# Patient Record
Sex: Female | Born: 1960 | Race: White | Hispanic: No | Marital: Married | Smoking: Never smoker
Health system: Southern US, Community
[De-identification: ages and names within clinical notes are randomized; demographics above are authoritative.]

## PROBLEM LIST (undated history)

## (undated) DIAGNOSIS — E559 Vitamin D deficiency, unspecified: Secondary | ICD-10-CM

## (undated) DIAGNOSIS — I447 Left bundle-branch block, unspecified: Secondary | ICD-10-CM

## (undated) DIAGNOSIS — Z780 Asymptomatic menopausal state: Secondary | ICD-10-CM

## (undated) DIAGNOSIS — R638 Other symptoms and signs concerning food and fluid intake: Secondary | ICD-10-CM

## (undated) DIAGNOSIS — K589 Irritable bowel syndrome without diarrhea: Secondary | ICD-10-CM

## (undated) DIAGNOSIS — N879 Dysplasia of cervix uteri, unspecified: Secondary | ICD-10-CM

## (undated) DIAGNOSIS — N950 Postmenopausal bleeding: Secondary | ICD-10-CM

## (undated) DIAGNOSIS — A6000 Herpesviral infection of urogenital system, unspecified: Secondary | ICD-10-CM

## (undated) DIAGNOSIS — E7800 Pure hypercholesterolemia, unspecified: Secondary | ICD-10-CM

## (undated) DIAGNOSIS — M8580 Other specified disorders of bone density and structure, unspecified site: Secondary | ICD-10-CM

## (undated) HISTORY — PX: OTHER SURGICAL HISTORY: SHX169

## (undated) HISTORY — DX: Asymptomatic menopausal state: Z78.0

## (undated) HISTORY — DX: Herpesviral infection of urogenital system, unspecified: A60.00

## (undated) HISTORY — PX: BREAST EXCISIONAL BIOPSY: SUR124

## (undated) HISTORY — DX: Pure hypercholesterolemia, unspecified: E78.00

## (undated) HISTORY — DX: Irritable bowel syndrome, unspecified: K58.9

## (undated) HISTORY — PX: CARDIAC SURGERY: SHX584

## (undated) HISTORY — DX: Dysplasia of cervix uteri, unspecified: N87.9

## (undated) HISTORY — DX: Postmenopausal bleeding: N95.0

## (undated) HISTORY — DX: Left bundle-branch block, unspecified: I44.7

## (undated) HISTORY — PX: LEEP: SHX91

## (undated) HISTORY — DX: Other symptoms and signs concerning food and fluid intake: R63.8

## (undated) HISTORY — PX: DIAGNOSTIC LAPAROSCOPY: SUR761

## (undated) HISTORY — DX: Vitamin D deficiency, unspecified: E55.9

## (undated) HISTORY — DX: Other specified disorders of bone density and structure, unspecified site: M85.80

## (undated) HISTORY — PX: BREAST BIOPSY: SHX20

---
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
## 2000-07-05 HISTORY — PX: TUBAL LIGATION: SHX77

## 2003-08-26 ENCOUNTER — Other Ambulatory Visit: Payer: Self-pay

## 2004-08-06 ENCOUNTER — Ambulatory Visit: Payer: Self-pay | Admitting: Occupational Therapy

## 2005-08-10 ENCOUNTER — Ambulatory Visit: Payer: Self-pay | Admitting: Obstetrics and Gynecology

## 2006-08-16 ENCOUNTER — Ambulatory Visit: Payer: Self-pay | Admitting: Obstetrics and Gynecology

## 2007-09-11 ENCOUNTER — Ambulatory Visit: Payer: Self-pay | Admitting: Obstetrics and Gynecology

## 2008-09-12 ENCOUNTER — Ambulatory Visit: Payer: Self-pay | Admitting: Obstetrics and Gynecology

## 2009-12-10 ENCOUNTER — Encounter: Admission: RE | Admit: 2009-12-10 | Discharge: 2009-12-10 | Payer: Self-pay | Admitting: Neurosurgery

## 2010-03-25 ENCOUNTER — Ambulatory Visit: Payer: Self-pay | Admitting: Obstetrics and Gynecology

## 2010-04-07 ENCOUNTER — Ambulatory Visit: Payer: Self-pay | Admitting: Obstetrics and Gynecology

## 2010-06-23 ENCOUNTER — Ambulatory Visit: Payer: Self-pay

## 2011-05-13 ENCOUNTER — Ambulatory Visit: Payer: Self-pay | Admitting: Obstetrics and Gynecology

## 2011-07-06 LAB — HM COLONOSCOPY

## 2012-05-22 ENCOUNTER — Ambulatory Visit: Payer: Self-pay | Admitting: Gastroenterology

## 2012-08-22 ENCOUNTER — Ambulatory Visit: Payer: Self-pay | Admitting: Obstetrics and Gynecology

## 2014-03-22 LAB — HM PAP SMEAR: HM Pap smear: NEGATIVE

## 2014-03-26 DIAGNOSIS — I447 Left bundle-branch block, unspecified: Secondary | ICD-10-CM | POA: Insufficient documentation

## 2014-03-26 DIAGNOSIS — E785 Hyperlipidemia, unspecified: Secondary | ICD-10-CM | POA: Insufficient documentation

## 2014-10-01 ENCOUNTER — Ambulatory Visit: Payer: Self-pay | Admitting: Obstetrics and Gynecology

## 2014-10-01 LAB — HM MAMMOGRAPHY

## 2014-12-17 ENCOUNTER — Other Ambulatory Visit: Payer: Self-pay

## 2014-12-17 ENCOUNTER — Other Ambulatory Visit: Payer: BLUE CROSS/BLUE SHIELD

## 2014-12-17 ENCOUNTER — Ambulatory Visit (INDEPENDENT_AMBULATORY_CARE_PROVIDER_SITE_OTHER): Payer: BLUE CROSS/BLUE SHIELD | Admitting: Obstetrics and Gynecology

## 2014-12-17 ENCOUNTER — Encounter: Payer: Self-pay | Admitting: Obstetrics and Gynecology

## 2014-12-17 VITALS — BP 122/68 | HR 46 | Ht 65.0 in | Wt 158.6 lb

## 2014-12-17 DIAGNOSIS — N950 Postmenopausal bleeding: Secondary | ICD-10-CM

## 2014-12-17 DIAGNOSIS — Z780 Asymptomatic menopausal state: Secondary | ICD-10-CM | POA: Diagnosis not present

## 2014-12-17 NOTE — Patient Instructions (Signed)
1.  No need for endometrial biopsy due to normal ultrasound. 2.  Monitor for vasomotor symptoms or vaginal dryness. 3.  Follow-up in September for annual exam as scheduled. 4.  Continue with calcium supplementation and weightbearing exercise.

## 2014-12-17 NOTE — Progress Notes (Signed)
Patient ID: Evelyn Reese, female   DOB: 05-22-1961, 54 y.o.   MRN: [id number]   Pt is a 3 month f/u on pmb. Pt d/c all hrt about 3 months ago. NO bleeding. See u/s report.  GYN ENCOUNTER NOTE  Subjective:       Evelyn Reese is a 54 y.o. No obstetric history on file. female is here for gynecologic evaluation of the following issues:  1. Postmenopausal bleeding. 2.  Follow-up on pelvic ultrasound.  Evelyn Reese presents today for follow-up on pelvic ultrasound.  She previously had been on estradiol and micronized progesterone for hormone replacement therapy and had developed postmenopausal spotting.  3 months ago.  She discontinued her hormone replacement therapy and had no further bleeding.  Review of pelvic ultrasound now demonstrates the patient having a normal menstrual stripe measuring 4 mm.  No other focal length from allergies of the uterus are seen.  The patient remains asymptomatic off of HRT therapy.  She wishes to not return to HRT therapy.  Previously, the patient had undergone an attempted endometrial biopsy.  Because of cervical stenosis.  She is not requiring biopsy at this time.  She will continue with menstrual calendar, monitoring and notify us if she develops any bleeding.  She is scheduled for follow-up in September 2016 normal exam..     Gynecologic History Patient's last menstrual period was 09/16/2014 (approximate). Contraception: post menopausal status  Obstetric History OB History  No data available    Past Medical History  Diagnosis Date  . Genital herpes   . Hypercholesterolemia   . Increased BMI   . Dysplasia of cervix   . IBS (irritable bowel syndrome)   . Menopause   . PMB (postmenopausal bleeding)   . Vitamin D deficiency   . Osteopenia     Past Surgical History  Procedure Laterality Date  . Tubal ligation  2002  . Nodule removed Left 1980    breast  . Tonsillectomy  1972  . Diagnostic laparoscopy      Current Outpatient Prescriptions  on File Prior to Visit  Medication Sig Dispense Refill  . calcium-vitamin D (OSCAL) 250-125 MG-UNIT per tablet Take 1 tablet by mouth daily.     No current facility-administered medications on file prior to visit.    Allergies  Allergen Reactions  . Penicillins Rash    History   Social History  . Marital Status: Married    Spouse Name: N/A  . Number of Children: N/A  . Years of Education: N/A   Occupational History  . Not on file.   Social History Main Topics  . Smoking status: Never Smoker   . Smokeless tobacco: Not on file  . Alcohol Use: 0.6 oz/week    1 Standard drinks or equivalent per week  . Drug Use: No  . Sexual Activity:    Partners: Male   Other Topics Concern  . Not on file   Social History Narrative    Family History  Problem Relation Age of Onset  . Lymphoma Mother   . Hypertension Father   . Arthritis Father   . Heart disease Brother   . Breast cancer Neg Hx   . Ovarian cancer Neg Hx   . Colon cancer Neg Hx     The following portions of the patient's history were reviewed and updated as appropriate: allergies, current medications, past family history, past medical history, past social history, past surgical history and problem list.  Review of Systems Review of Systems -  General ROS: negative for - chills, fatigue, fever, hot flashes, malaise or night sweats Hematological and Lymphatic ROS: negative for - bleeding problems or swollen lymph nodes Gastrointestinal ROS: negative for - abdominal pain, blood in stools, change in bowel habits and nausea/vomiting Musculoskeletal ROS: negative for - joint pain, muscle pain or muscular weakness Genito-Urinary ROS: negative for - change in menstrual cycle, dysmenorrhea, dyspareunia, dysuria, genital discharge, genital ulcers, hematuria, incontinence, irregular/heavy menses, nocturia or pelvic pain  Objective:   BP 122/68 mmHg  Pulse 46  Ht 5\' 5"  (1.651 m)  Wt 158 lb 9.6 oz (71.94 kg)  BMI 26.39  kg/m2  LMP 09/16/2014 (Approximate) Deferred  Assessment:   1.  Postmenopausal bleeding   Plan:    1.  No endometrial biopsy needed. 2.  Discontinue all HRT therapy. 3.  Menstrual calendar, monitoring. 4.  Annual exam September 2016  A total of 15 minutes were spent face-to-face with the patient during this encounter and over half of that time dealt with counseling and coordination of care.

## 2014-12-18 DIAGNOSIS — Z780 Asymptomatic menopausal state: Secondary | ICD-10-CM | POA: Insufficient documentation

## 2014-12-18 DIAGNOSIS — K589 Irritable bowel syndrome without diarrhea: Secondary | ICD-10-CM | POA: Insufficient documentation

## 2014-12-18 DIAGNOSIS — N950 Postmenopausal bleeding: Secondary | ICD-10-CM | POA: Insufficient documentation

## 2014-12-18 DIAGNOSIS — E785 Hyperlipidemia, unspecified: Secondary | ICD-10-CM | POA: Insufficient documentation

## 2014-12-18 DIAGNOSIS — M8580 Other specified disorders of bone density and structure, unspecified site: Secondary | ICD-10-CM | POA: Insufficient documentation

## 2014-12-18 NOTE — Patient Instructions (Signed)
1. No endometrial biopsy needed.  2. Discontinue all HRT therapy.  3. Menstrual calendar, monitoring.  4. Annual exam September 2016

## 2015-04-01 ENCOUNTER — Encounter: Payer: Self-pay | Admitting: Obstetrics and Gynecology

## 2015-04-22 ENCOUNTER — Encounter: Payer: Self-pay | Admitting: Obstetrics and Gynecology

## 2015-04-22 ENCOUNTER — Ambulatory Visit (INDEPENDENT_AMBULATORY_CARE_PROVIDER_SITE_OTHER): Payer: BLUE CROSS/BLUE SHIELD | Admitting: Obstetrics and Gynecology

## 2015-04-22 VITALS — BP 110/64 | HR 42 | Ht 65.0 in | Wt 167.0 lb

## 2015-04-22 DIAGNOSIS — Z0000 Encounter for general adult medical examination without abnormal findings: Secondary | ICD-10-CM

## 2015-04-22 DIAGNOSIS — Z1211 Encounter for screening for malignant neoplasm of colon: Secondary | ICD-10-CM

## 2015-04-22 DIAGNOSIS — A6000 Herpesviral infection of urogenital system, unspecified: Secondary | ICD-10-CM

## 2015-04-22 DIAGNOSIS — N879 Dysplasia of cervix uteri, unspecified: Secondary | ICD-10-CM

## 2015-04-22 DIAGNOSIS — Z01419 Encounter for gynecological examination (general) (routine) without abnormal findings: Secondary | ICD-10-CM

## 2015-04-22 NOTE — Progress Notes (Signed)
Patient ID: Evelyn Reese, female   DOB: [birth year], 54 y.o.   MRN: [id number] ANNUAL PREVENTATIVE CARE GYN  ENCOUNTER NOTE  Subjective:       Evelyn EverySherri W Boonstra is a 54 y.o. No obstetric history on file. female here for a routine annual gynecologic exam.  Current complaints: 1.  Annual    Gynecologic History Menopause Contraception: n/a Last Pap: 03/12/2013. Results were: negative/negative hpv Last mammogram: 10/01/2014 Results were: [facility] 1  Obstetric History OB History  No data available    Past Medical History  Diagnosis Date  . Genital herpes   . Hypercholesterolemia   . Increased BMI   . Dysplasia of cervix   . IBS (irritable bowel syndrome)   . Menopause   . PMB (postmenopausal bleeding)   . Vitamin D deficiency   . Osteopenia     Past Surgical History  Procedure Laterality Date  . Tubal ligation  2002  . Nodule removed Left 1980    breast  . Tonsillectomy  1972  . Diagnostic laparoscopy      Current Outpatient Prescriptions on File Prior to Visit  Medication Sig Dispense Refill  . calcium-vitamin D (OSCAL) 250-125 MG-UNIT per tablet Take 1 tablet by mouth daily.    Marland Kitchen. ibuprofen (ADVIL,MOTRIN) 200 MG tablet Take 200 mg by mouth as needed.    Marland Kitchen. ibuprofen (ADVIL,MOTRIN) 800 MG tablet Take by mouth.     No current facility-administered medications on file prior to visit.    Allergies  Allergen Reactions  . Penicillins Rash    Social History   Social History  . Marital Status: Married    Spouse Name: N/A  . Number of Children: N/A  . Years of Education: N/A   Occupational History  . Not on file.   Social History Main Topics  . Smoking status: Never Smoker   . Smokeless tobacco: Not on file  . Alcohol Use: 0.6 oz/week    1 Standard drinks or equivalent per week  . Drug Use: No  . Sexual Activity:    Partners: Male   Other Topics Concern  . Not on file   Social History Narrative    Family History  Problem Relation Age of Onset  .  Lymphoma Mother   . Hypertension Father   . Arthritis Father   . Heart disease Brother   . Breast cancer Neg Hx   . Ovarian cancer Neg Hx   . Colon cancer Neg Hx     The following portions of the patient's history were reviewed and updated as appropriate: allergies, current medications, past family history, past medical history, past social history, past surgical history and problem list.  Review of Systems ROS Review of Systems - General ROS: negative for - chills, fatigue, fever, hot flashes, night sweats, weight gain or weight loss Psychological ROS: negative for - anxiety, decreased libido, depression, mood swings, physical abuse or sexual abuse Ophthalmic ROS: negative for - blurry vision, eye pain or loss of vision ENT ROS: negative for - headaches, hearing change, visual changes or vocal changes Allergy and Immunology ROS: negative for - hives, itchy/watery eyes or seasonal allergies Hematological and Lymphatic ROS: negative for - bleeding problems, bruising, swollen lymph nodes or weight loss Endocrine ROS: negative for - galactorrhea, hair pattern changes, hot flashes, malaise/lethargy, mood swings, palpitations, polydipsia/polyuria, skin changes, temperature intolerance or unexpected weight changes Breast ROS: negative for - new or changing breast lumps or nipple discharge Respiratory ROS: negative for - cough or shortness  of breath Cardiovascular ROS: negative for - chest pain, irregular heartbeat, palpitations or shortness of breath Gastrointestinal ROS: no abdominal pain, change in bowel habits, or black or bloody stools Genito-Urinary ROS: no dysuria, trouble voiding, or hematuria Musculoskeletal ROS: negative for - joint pain or joint stiffness Neurological ROS: negative for - bowel and bladder control changes Dermatological ROS: negative for rash and skin lesion changes   Objective:   LMP 09/16/2014 (Approximate) CONSTITUTIONAL: Well-developed, well-nourished female in  no acute distress.  PSYCHIATRIC: Normal mood and affect. Normal behavior. Normal judgment and thought content. NEUROLGIC: Alert and oriented to person, place, and time. Normal muscle tone coordination. No cranial nerve deficit noted. HENT:  Normocephalic, atraumatic, External right and left ear normal. Oropharynx is clear and moist EYES: Conjunctivae and EOM are normal. Pupils are equal, round, and reactive to light. No scleral icterus.  NECK: Normal range of motion, supple, no masses.  Normal thyroid.  SKIN: Skin is warm and dry. No rash noted. Not diaphoretic. No erythema. No pallor. CARDIOVASCULAR: Normal heart rate noted, regular rhythm, no murmur. Porfirio Oar: Clear to auscultation bilaterally. Effort and breath sounds normal, no problems with respiration noted. BREASTS: Symmetric in size. No masses, skin changes, nipple drainage, or lymphadenopathy. DOMEN: Soft, normal bowel sounds, no distention noted.  No tenderness, rebound or guarding.  BLADDER: Normal PELVIC:  External Genitalia: Normal  BUS: Normal  Vagina: Normal  Cervix: Normal  Uterus: Normal  Adnexa: Normal  RV: External Exam NormaI, No Rectal Masses and Normal Sphincter tone  MUSCULOSKELETAL: Normal range of motion. No tenderness.  No cyanosis, clubbing, or edema.  2+ distal pulses. LYMPHATIC: No Axillary, Supraclavicular, or Inguinal Adenopathy.    Assessment:   Annual gynecologic examination 54 y.o. Contraception: menopause BMI: 27.8  (2015 BMI: 26.4. Gain of approx. 8 lbs.)   Plan:  Pap: 03/22/2013 negative/negative. Pap smear is needed in 2017. Mammogram: not ordered (done 09/2014) Stool Guaiac Testing:  ordered Labs: done by pcp Routine preventative health maintenance measures emphasized: Exercise/Diet/Weight control, Tobacco Warnings and Alcohol/Substance use risks Return to Clinic - 1 Year   Fenton Malling, LPN  Herold Harms, MD

## 2015-04-22 NOTE — Patient Instructions (Signed)
1.  Pap smear is due in 2017. 2.  Next mammogram is due in March 2017. 3.  Stool guaiac cards for colon cancer screening are given. 4.  Routine screening labs are to be done at [facility]. 5.  Return in one year for annual physical

## 2015-04-27 DIAGNOSIS — A6000 Herpesviral infection of urogenital system, unspecified: Secondary | ICD-10-CM | POA: Insufficient documentation

## 2015-04-27 DIAGNOSIS — G43909 Migraine, unspecified, not intractable, without status migrainosus: Secondary | ICD-10-CM | POA: Insufficient documentation

## 2015-04-27 DIAGNOSIS — M064 Inflammatory polyarthropathy: Secondary | ICD-10-CM | POA: Insufficient documentation

## 2015-04-27 DIAGNOSIS — Z807 Family history of other malignant neoplasms of lymphoid, hematopoietic and related tissues: Secondary | ICD-10-CM | POA: Insufficient documentation

## 2015-04-27 DIAGNOSIS — N879 Dysplasia of cervix uteri, unspecified: Secondary | ICD-10-CM | POA: Insufficient documentation

## 2015-04-27 DIAGNOSIS — E559 Vitamin D deficiency, unspecified: Secondary | ICD-10-CM | POA: Insufficient documentation

## 2015-04-27 DIAGNOSIS — M419 Scoliosis, unspecified: Secondary | ICD-10-CM | POA: Insufficient documentation

## 2015-05-05 ENCOUNTER — Other Ambulatory Visit: Payer: Self-pay | Admitting: Obstetrics and Gynecology

## 2015-05-09 LAB — FECAL OCCULT BLOOD, IMMUNOCHEMICAL: Fecal Occult Bld: NEGATIVE

## 2016-04-22 ENCOUNTER — Ambulatory Visit (INDEPENDENT_AMBULATORY_CARE_PROVIDER_SITE_OTHER): Payer: BLUE CROSS/BLUE SHIELD | Admitting: Obstetrics and Gynecology

## 2016-04-22 ENCOUNTER — Encounter: Payer: Self-pay | Admitting: Obstetrics and Gynecology

## 2016-04-22 VITALS — BP 87/53 | HR 105 | Ht 62.0 in | Wt 171.1 lb

## 2016-04-22 DIAGNOSIS — Z1211 Encounter for screening for malignant neoplasm of colon: Secondary | ICD-10-CM | POA: Diagnosis not present

## 2016-04-22 DIAGNOSIS — A6000 Herpesviral infection of urogenital system, unspecified: Secondary | ICD-10-CM | POA: Diagnosis not present

## 2016-04-22 DIAGNOSIS — Z01419 Encounter for gynecological examination (general) (routine) without abnormal findings: Secondary | ICD-10-CM | POA: Diagnosis not present

## 2016-04-22 DIAGNOSIS — N952 Postmenopausal atrophic vaginitis: Secondary | ICD-10-CM | POA: Diagnosis not present

## 2016-04-22 DIAGNOSIS — N879 Dysplasia of cervix uteri, unspecified: Secondary | ICD-10-CM

## 2016-04-22 DIAGNOSIS — Z1231 Encounter for screening mammogram for malignant neoplasm of breast: Secondary | ICD-10-CM | POA: Diagnosis not present

## 2016-04-22 DIAGNOSIS — Z780 Asymptomatic menopausal state: Secondary | ICD-10-CM | POA: Diagnosis not present

## 2016-04-22 DIAGNOSIS — Z1239 Encounter for other screening for malignant neoplasm of breast: Secondary | ICD-10-CM

## 2016-04-22 NOTE — Patient Instructions (Signed)
1. Pap smear is completed 2. Mammogram is ordered 3. Stool guaiac cards are given for colon cancer screening 4. Continue with calcium and vitamin D supplementation 5. Continue with healthy eating and exercise 6. Consider Intrarosa for vaginal dryness 7. Return in 1 year for physical

## 2016-04-22 NOTE — Progress Notes (Signed)
ANNUAL PREVENTATIVE CARE GYN  ENCOUNTER NOTE  Subjective:       Evelyn Reese is a 55 y.o. 702P2002 female here for a routine annual gynecologic exam.  Current complaints: 1.   None  Main gynecologic issue is vaginal dryness where she is intermittently using lubricants only previously she had been on estrogen but does not does desire to be on that medication. Bowel and bladder function are normal. Patient notes occasional vasomotor symptom.   Gynecologic History Patient's last menstrual period was 09/16/2014 (approximate). Contraception: post menopausal status Last Pap: 03/2013 neg/neg. Results were: normal Last mammogram: 09/2014 birad 1. Results were: normal  Obstetric History OB History  Gravida Para Term Preterm AB Living  2 2 2     2   SAB TAB Ectopic Multiple Live Births          2    # Outcome Date GA Lbr Len/2nd Weight Sex Delivery Anes PTL Lv  2 Term      Vag-Spont   LIV  1 Term      Vag-Spont   LIV      Past Medical History:  Diagnosis Date  . Dysplasia of cervix   . Genital herpes   . Hypercholesterolemia   . IBS (irritable bowel syndrome)   . Increased BMI   . Menopause   . Osteopenia   . PMB (postmenopausal bleeding)   . Vitamin D deficiency     Past Surgical History:  Procedure Laterality Date  . DIAGNOSTIC LAPAROSCOPY    . LEEP    . nodule removed Left 1980   breast  . TONSILLECTOMY  1972  . TUBAL LIGATION  2002    Current Outpatient Prescriptions on File Prior to Visit  Medication Sig Dispense Refill  . Calcium Carb-Ergocalciferol 250-125 MG-UNIT TABS Take by mouth.    . calcium-vitamin D (OSCAL) 250-125 MG-UNIT per tablet Take 1 tablet by mouth daily.    Marland Kitchen. ibuprofen (ADVIL,MOTRIN) 200 MG tablet Take 200 mg by mouth as needed.    Marland Kitchen. ibuprofen (ADVIL,MOTRIN) 800 MG tablet Take by mouth.    . Multiple Vitamin (MULTI-VITAMINS) TABS Take by mouth.     No current facility-administered medications on file prior to visit.     Allergies  Allergen  Reactions  . Penicillins Rash    Social History   Social History  . Marital status: Married    Spouse name: N/A  . Number of children: N/A  . Years of education: N/A   Occupational History  . Not on file.   Social History Main Topics  . Smoking status: Never Smoker  . Smokeless tobacco: Never Used  . Alcohol use 0.6 oz/week    1 Standard drinks or equivalent per week  . Drug use: No  . Sexual activity: Yes    Partners: Male   Other Topics Concern  . Not on file   Social History Narrative  . No narrative on file    Family History  Problem Relation Age of Onset  . Lymphoma Mother   . Hypertension Father   . Arthritis Father   . Heart disease Brother   . Breast cancer Neg Hx   . Ovarian cancer Neg Hx   . Colon cancer Neg Hx     The following portions of the patient's history were reviewed and updated as appropriate: allergies, current medications, past family history, past medical history, past social history, past surgical history and problem list.  Review of Systems ROS Review of Systems -  General ROS: negative for - chills, fatigue, fever, hot flashes, night sweats, weight gain or weight loss Psychological ROS: negative for - anxiety, decreased libido, depression, mood swings, physical abuse or sexual abuse Ophthalmic ROS: negative for - blurry vision, eye pain or loss of vision ENT ROS: negative for - headaches, hearing change, visual changes or vocal changes Allergy and Immunology ROS: negative for - hives, itchy/watery eyes or seasonal allergies Hematological and Lymphatic ROS: negative for - bleeding problems, bruising, swollen lymph nodes or weight loss Endocrine ROS: negative for - galactorrhea, hair pattern changes, hot flashes, malaise/lethargy, mood swings, palpitations, polydipsia/polyuria, skin changes, temperature intolerance or unexpected weight changes Breast ROS: negative for - new or changing breast lumps or nipple discharge Respiratory ROS:  negative for - cough or shortness of breath Cardiovascular ROS: negative for - chest pain, irregular heartbeat, palpitations or shortness of breath Gastrointestinal ROS: no abdominal pain, change in bowel habits, or black or bloody stools Genito-Urinary ROS: no dysuria, trouble voiding, or hematuria Musculoskeletal ROS: negative for - joint pain or joint stiffness Neurological ROS: negative for - bowel and bladder control changes Dermatological ROS: negative for rash and skin lesion changes   Objective:   LMP 09/16/2014 (Approximate)   BP (!) 87/53   Pulse (!) 105   Ht 5\' 2"  (1.575 m)   Wt 171 lb 1.6 oz (77.6 kg)   LMP 09/16/2014 (Approximate)   BMI 31.29 kg/m   CONSTITUTIONAL: Well-developed, well-nourished female in no acute distress.  PSYCHIATRIC: Normal mood and affect. Normal behavior. Normal judgment and thought content. NEUROLGIC: Alert and oriented to person, place, and time. Normal muscle tone coordination. No cranial nerve deficit noted. HENT:  Normocephalic, atraumatic,o EYES: Conjunctivae and EOM are normal.  No scleral icterus.  NECK: Normal range of motion, supple, no masses.  Normal thyroid.  SKIN: Skin is warm and dry. No rash noted. Not diaphoretic. No erythema. No pallor. CARDIOVASCULAR: Normal heart rate noted, regular rhythm, no murmur. RESPIRATORY: Clear to auscultation bilaterally. Effort and breath sounds normal, no problems with respiration noted. BREASTS: Symmetric in size. No masses, skin changes, nipple drainage, or lymphadenopathy. ABDOMEN: Soft, normal bowel sounds, no distention noted.  No tenderness, rebound or guarding.  BLADDER: Normal PELVIC:  External Genitalia: Normal  BUS: Normal  Vagina: Severe atrophy  Cervix: Scarred; os is pinpoint; no cervical motion tenderness  Uterus: Normal; small, mobile, midplane  Adnexa: Normal; nonpalpable and nontender  RV: External Exam NormaI, No Rectal Masses and Normal Sphincter tone  MUSCULOSKELETAL: Normal  range of motion. No tenderness.  No cyanosis, clubbing, or edema.  2+ distal pulses. LYMPHATIC: No Axillary, Supraclavicular, or Inguinal Adenopathy.    Assessment:   Annual gynecologic examination 55 y.o. Contraception: post menopausal status [id number] Problem List Items Addressed This Visit    Menopause   Genital herpes   Cervical dysplasia    Other Visit Diagnoses    Well woman exam    -  Primary      Plan:  Pap: pap.hpv Mammogram: utd Stool Guaiac Testing:  Ordered Labs: thru pcp Routine preventative health maintenance measures emphasized: Exercise/Diet/Weight control, Tobacco Warnings and Alcohol/Substance use risks Consider Intrarosa for vaginal dryness. Continue with lubricants Return to Clinic - 1 Year   Crystal [place], [place]  Herold Harms, MD  Note: This dictation was prepared with Dragon dictation along with smaller phrase technology. Any transcriptional errors that result from this process are unintentional.

## 2016-04-29 LAB — PAP IG AND HPV HIGH-RISK
HPV, HIGH-RISK: NEGATIVE
PAP Smear Comment: 0

## 2016-04-30 ENCOUNTER — Telehealth: Payer: Self-pay | Admitting: Obstetrics and Gynecology

## 2016-04-30 NOTE — Telephone Encounter (Signed)
Patient called wanting to speak with you specifically. Patient knows you are not in the office today. Thanks

## 2016-05-03 NOTE — Telephone Encounter (Signed)
lmtrc

## 2016-05-04 NOTE — Telephone Encounter (Signed)
lmtrc

## 2016-05-06 ENCOUNTER — Telehealth: Payer: Self-pay | Admitting: Obstetrics and Gynecology

## 2016-05-06 NOTE — Telephone Encounter (Signed)
Patient returned your call. She stated not to call her home number but her cell [phone].Thanks

## 2016-05-07 NOTE — Telephone Encounter (Signed)
Pt aware of pap results.

## 2016-05-18 LAB — FECAL OCCULT BLOOD, IMMUNOCHEMICAL: FECAL OCCULT BLD: NEGATIVE

## 2016-08-18 DIAGNOSIS — I5022 Chronic systolic (congestive) heart failure: Secondary | ICD-10-CM | POA: Insufficient documentation

## 2016-09-16 DIAGNOSIS — Z8679 Personal history of other diseases of the circulatory system: Secondary | ICD-10-CM | POA: Insufficient documentation

## 2016-09-16 DIAGNOSIS — Q245 Malformation of coronary vessels: Secondary | ICD-10-CM | POA: Insufficient documentation

## 2016-09-16 DIAGNOSIS — Z952 Presence of prosthetic heart valve: Secondary | ICD-10-CM | POA: Insufficient documentation

## 2016-09-16 DIAGNOSIS — Z9889 Other specified postprocedural states: Secondary | ICD-10-CM | POA: Insufficient documentation

## 2016-09-19 DIAGNOSIS — I2510 Atherosclerotic heart disease of native coronary artery without angina pectoris: Secondary | ICD-10-CM | POA: Insufficient documentation

## 2016-09-21 DIAGNOSIS — Z7901 Long term (current) use of anticoagulants: Secondary | ICD-10-CM | POA: Insufficient documentation

## 2016-09-21 DIAGNOSIS — Z5181 Encounter for therapeutic drug level monitoring: Secondary | ICD-10-CM | POA: Insufficient documentation

## 2016-09-22 DIAGNOSIS — I4891 Unspecified atrial fibrillation: Secondary | ICD-10-CM | POA: Insufficient documentation

## 2016-12-07 ENCOUNTER — Other Ambulatory Visit: Payer: Self-pay | Admitting: Cardiology

## 2016-12-07 DIAGNOSIS — R1011 Right upper quadrant pain: Secondary | ICD-10-CM

## 2016-12-13 ENCOUNTER — Ambulatory Visit
Admission: RE | Admit: 2016-12-13 | Discharge: 2016-12-13 | Disposition: A | Discharge status: Home / Self Care | Payer: BLUE CROSS/BLUE SHIELD | Source: Ambulatory Visit | Attending: Cardiology | Admitting: Cardiology

## 2016-12-13 DIAGNOSIS — K8020 Calculus of gallbladder without cholecystitis without obstruction: Secondary | ICD-10-CM | POA: Diagnosis not present

## 2016-12-13 DIAGNOSIS — R1011 Right upper quadrant pain: Secondary | ICD-10-CM | POA: Diagnosis present

## 2017-01-25 ENCOUNTER — Ambulatory Visit: Payer: Self-pay

## 2017-02-02 DIAGNOSIS — Z95810 Presence of automatic (implantable) cardiac defibrillator: Secondary | ICD-10-CM | POA: Insufficient documentation

## 2017-04-25 NOTE — Progress Notes (Signed)
ANNUAL PREVENTATIVE CARE GYN  ENCOUNTER NOTE  Subjective:       Evelyn Reese is a 56 y.o. [id number] female here for a routine annual gynecologic exam.  Current complaints: 1.   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  She is currently undergoing with cardiac rehab at this time.   Bowel and bladder function are normal. Patient notes occasional vasomotor symptom. Patient remains sexually active and uses lubricants to help with dryness.   Gynecologic History Patient's last menstrual period was 09/16/2014 (approximate). Contraception: post menopausal status Last Pap: 04/22/2016 neg/neg. Results were: normal Last mammogram: 10/01/2014 birad 1. Results were: normal  Obstetric History OB History  Gravida Para Term Preterm AB Living  2 2 2     2   SAB TAB Ectopic Multiple Live Births          2    # Outcome Date GA Lbr Len/2nd Weight Sex Delivery Anes PTL Lv  2 Term      Vag-Spont   LIV  1 Term      Vag-Spont   LIV      Past Medical History:  Diagnosis Date  . Dysplasia of cervix   . Genital herpes   . Hypercholesterolemia   . IBS (irritable bowel syndrome)   . Increased BMI   . Menopause   . Osteopenia   . PMB (postmenopausal bleeding)   . Vitamin D deficiency     Past Surgical History:  Procedure Laterality Date  . DIAGNOSTIC LAPAROSCOPY    . LEEP    . nodule removed Left 1980   breast  . TONSILLECTOMY  1972  . TUBAL LIGATION  2002    Current Outpatient Prescriptions on File Prior to Visit  Medication Sig Dispense Refill  . calcium-vitamin D (OSCAL) 250-125 MG-UNIT per tablet Take 1 tablet by mouth daily.    Marland Kitchen. ibuprofen (ADVIL,MOTRIN) 800 MG tablet Take by mouth.    . Multiple Vitamin (MULTI-VITAMINS) TABS Take by mouth.     No  current facility-administered medications on file prior to visit.     Allergies  Allergen Reactions  . Penicillins Rash    Social History   Social History  . Marital status: Married    Spouse name: N/A  . Number of children: N/A  . Years of education: N/A   Occupational History  . Not on file.   Social History Main Topics  . Smoking status: Never Smoker  . Smokeless tobacco: Never Used  . Alcohol use 0.6 oz/week    1 Standard drinks or equivalent per week     Comment: weekly  . Drug use: No  . Sexual activity: Yes    Partners: Male    Birth control/ protection: Post-menopausal   Other Topics Concern  . Not on file   Social History Narrative  . No narrative on file    Family History  Problem Relation Age of Onset  . Lymphoma Mother   . Hypertension Father   . Arthritis Father   . Heart disease Brother   . Breast cancer Neg Hx   . Ovarian cancer Neg Hx   . Colon cancer Neg Hx   . Diabetes Neg Hx     The following portions of the patient's history were reviewed and updated as appropriate: allergies, current medications, past family history, past medical history,  past social history, past surgical history and problem list.  Review of Systems Review of Systems  Constitutional: Positive for malaise/fatigue.  HENT: Negative.   Eyes: Negative.   Respiratory: Negative.   Cardiovascular: Negative.   Gastrointestinal: Negative.   Genitourinary: Negative.   Musculoskeletal: Negative.   Skin: Negative.   Neurological: Negative.   Endo/Heme/Allergies: Negative.   Psychiatric/Behavioral: Negative.      Objective:   LMP 09/16/2014 (Approximate)  BP 113/78   Pulse 73   Ht 5\' 2"  (1.575 m)   Wt 178 lb 3.2 oz (80.8 kg)   LMP 09/16/2014 (Approximate)   BMI 32.59 kg/m  CONSTITUTIONAL: Well-developed, well-nourished female in no acute distress.  PSYCHIATRIC: Normal mood and affect. Normal behavior. Normal judgment and thought content. NEUROLGIC: Alert and  oriented to person, place, and time. Normal muscle tone coordination. No cranial nerve deficit noted. HENT:  Normocephalic, atraumatic,o EYES: Conjunctivae and EOM are normal.  No scleral icterus.  NECK: Normal range of motion, supple, no masses.  Normal thyroid.  SKIN: Skin is warm and dry. No rash noted. Not diaphoretic. No erythema. No pallor. CARDIOVASCULAR: Normal heart rate noted, regular rhythm, 2/6 systolic ejection murmur is audible; mechanical heart valve click is audible.  Thoracotomy scar is healed; automatic defibrillator/pacemaker implant scar is well-healed; minimal left chest wall tenderness RESPIRATORY: Clear to auscultation bilaterally. Effort and breath sounds normal, no problems with respiration noted. BREASTS: Symmetric in size. No masses, skin changes, nipple drainage, or lymphadenopathy. ABDOMEN: Soft, normal bowel sounds, no distention noted.  No tenderness, rebound or guarding.  BLADDER: Normal PELVIC:  External Genitalia: Normal  BUS: Normal  Vagina: Severe atrophy  Cervix: Scarred; os is pinpoint; no cervical motion tenderness  Uterus: Normal; small, mobile, midplane  Adnexa: Normal; nonpalpable and nontender  RV: External Exam NormaI, No Rectal Masses and Normal Sphincter tone  MUSCULOSKELETAL: Normal range of motion. No tenderness.  No cyanosis, clubbing, or edema.  2+ distal pulses. LYMPHATIC: No Axillary, Supraclavicular, or Inguinal Adenopathy.    Assessment:   Annual gynecologic examination 56 y.o. Contraception: post menopausal status [id number] Problem List Items Addressed This Visit    Menopause   Cervical dysplasia    Other Visit Diagnoses    Well woman exam    -  Primary   Screen for colon cancer       Screening for breast cancer       Vaginal atrophy          Plan:  Pap: Due 2020 Mammogram: Scheduled for 05/24/2017 Stool Guaiac Testing: thru pcp Labs: thru pcp Routine preventative health maintenance measures emphasized:  Exercise/Diet/Weight control, Tobacco Warnings and Alcohol/Substance use risks Return to Clinic - 1 Year   [employer], CMA  Herold Harms, MD   Note: This dictation was prepared with Dragon dictation along with smaller phrase technology. Any transcriptional errors that result from this process are unintentional.

## 2017-04-26 ENCOUNTER — Ambulatory Visit (INDEPENDENT_AMBULATORY_CARE_PROVIDER_SITE_OTHER): Payer: BLUE CROSS/BLUE SHIELD | Admitting: Obstetrics and Gynecology

## 2017-04-26 ENCOUNTER — Encounter: Payer: Self-pay | Admitting: Obstetrics and Gynecology

## 2017-04-26 VITALS — BP 113/78 | HR 73 | Ht 62.0 in | Wt 178.2 lb

## 2017-04-26 DIAGNOSIS — Z780 Asymptomatic menopausal state: Secondary | ICD-10-CM | POA: Diagnosis not present

## 2017-04-26 DIAGNOSIS — N879 Dysplasia of cervix uteri, unspecified: Secondary | ICD-10-CM | POA: Diagnosis not present

## 2017-04-26 DIAGNOSIS — N952 Postmenopausal atrophic vaginitis: Secondary | ICD-10-CM

## 2017-04-26 DIAGNOSIS — Z01419 Encounter for gynecological examination (general) (routine) without abnormal findings: Secondary | ICD-10-CM

## 2017-04-26 DIAGNOSIS — Z1211 Encounter for screening for malignant neoplasm of colon: Secondary | ICD-10-CM | POA: Diagnosis not present

## 2017-04-26 DIAGNOSIS — Z1239 Encounter for other screening for malignant neoplasm of breast: Secondary | ICD-10-CM

## 2017-04-26 DIAGNOSIS — Z1231 Encounter for screening mammogram for malignant neoplasm of breast: Secondary | ICD-10-CM | POA: Diagnosis not present

## 2017-04-26 NOTE — Patient Instructions (Signed)
1.  No Pap smear is needed.  Next Pap is due in 2020 2.  Mammogram is already scheduled for 05/24/2017 3.  Stool guaiac card testing is done through primary care 4.  Screening labs are done through primary care 5.  Continue with healthy eating and exercise 6.  Continue with calcium and vitamin D supplementation daily 7.  Return in 1 year for annual exam   Health Maintenance for Postmenopausal Women Menopause is a normal process in which your reproductive ability comes to an end. This process happens gradually over a span of months to years, usually between the ages of 10 and 17. Menopause is complete when you have missed 12 consecutive menstrual periods. It is important to talk with your health care provider about some of the most common conditions that affect postmenopausal women, such as heart disease, cancer, and bone loss (osteoporosis). Adopting a healthy lifestyle and getting preventive care can help to promote your health and wellness. Those actions can also lower your chances of developing some of these common conditions. What should I know about menopause? During menopause, you may experience a number of symptoms, such as:  Moderate-to-severe hot flashes.  Night sweats.  Decrease in sex drive.  Mood swings.  Headaches.  Tiredness.  Irritability.  Memory problems.  Insomnia.  Choosing to treat or not to treat menopausal changes is an individual decision that you make with your health care provider. What should I know about hormone replacement therapy and supplements? Hormone therapy products are effective for treating symptoms that are associated with menopause, such as hot flashes and night sweats. Hormone replacement carries certain risks, especially as you become older. If you are thinking about using estrogen or estrogen with progestin treatments, discuss the benefits and risks with your health care provider. What should I know about heart disease and stroke? Heart  disease, heart attack, and stroke become more likely as you age. This may be due, in part, to the hormonal changes that your body experiences during menopause. These can affect how your body processes dietary fats, triglycerides, and cholesterol. Heart attack and stroke are both medical emergencies. There are many things that you can do to help prevent heart disease and stroke:  Have your blood pressure checked at least every 1-2 years. High blood pressure causes heart disease and increases the risk of stroke.  If you are 77-68 years old, ask your health care provider if you should take aspirin to prevent a heart attack or a stroke.  Do not use any tobacco products, including cigarettes, chewing tobacco, or electronic cigarettes. If you need help quitting, ask your health care provider.  It is important to eat a healthy diet and maintain a healthy weight. ? Be sure to include plenty of vegetables, fruits, low-fat dairy products, and lean protein. ? Avoid eating foods that are high in solid fats, added sugars, or salt (sodium).  Get regular exercise. This is one of the most important things that you can do for your health. ? Try to exercise for at least 150 minutes each week. The type of exercise that you do should increase your heart rate and make you sweat. This is known as moderate-intensity exercise. ? Try to do strengthening exercises at least twice each week. Do these in addition to the moderate-intensity exercise.  Know your numbers.Ask your health care provider to check your cholesterol and your blood glucose. Continue to have your blood tested as directed by your health care provider.  What should I  know about cancer screening? There are several types of cancer. Take the following steps to reduce your risk and to catch any cancer development as early as possible. Breast Cancer  Practice breast self-awareness. ? This means understanding how your breasts normally appear and feel. ? It  also means doing regular breast self-exams. Let your health care provider know about any changes, no matter how small.  If you are 24 or older, have a clinician do a breast exam (clinical breast exam or CBE) every year. Depending on your age, family history, and medical history, it may be recommended that you also have a yearly breast X-ray (mammogram).  If you have a family history of breast cancer, talk with your health care provider about genetic screening.  If you are at high risk for breast cancer, talk with your health care provider about having an MRI and a mammogram every year.  Breast cancer (BRCA) gene test is recommended for women who have family members with BRCA-related cancers. Results of the assessment will determine the need for genetic counseling and BRCA1 and for BRCA2 testing. BRCA-related cancers include these types: ? Breast. This occurs in males or females. ? Ovarian. ? Tubal. This may also be called fallopian tube cancer. ? Cancer of the abdominal or pelvic lining (peritoneal cancer). ? Prostate. ? Pancreatic.  Cervical, Uterine, and Ovarian Cancer Your health care provider may recommend that you be screened regularly for cancer of the pelvic organs. These include your ovaries, uterus, and vagina. This screening involves a pelvic exam, which includes checking for microscopic changes to the surface of your cervix (Pap test).  For women ages 21-65, health care providers may recommend a pelvic exam and a Pap test every three years. For women ages 89-65, they may recommend the Pap test and pelvic exam, combined with testing for human papilloma virus (HPV), every five years. Some types of HPV increase your risk of cervical cancer. Testing for HPV may also be done on women of any age who have unclear Pap test results.  Other health care providers may not recommend any screening for nonpregnant women who are considered low risk for pelvic cancer and have no symptoms. Ask your  health care provider if a screening pelvic exam is right for you.  If you have had past treatment for cervical cancer or a condition that could lead to cancer, you need Pap tests and screening for cancer for at least 20 years after your treatment. If Pap tests have been discontinued for you, your risk factors (such as having a new sexual partner) need to be reassessed to determine if you should start having screenings again. Some women have medical problems that increase the chance of getting cervical cancer. In these cases, your health care provider may recommend that you have screening and Pap tests more often.  If you have a family history of uterine cancer or ovarian cancer, talk with your health care provider about genetic screening.  If you have vaginal bleeding after reaching menopause, tell your health care provider.  There are currently no reliable tests available to screen for ovarian cancer.  Lung Cancer Lung cancer screening is recommended for adults 20-19 years old who are at high risk for lung cancer because of a history of smoking. A yearly low-dose CT scan of the lungs is recommended if you:  Currently smoke.  Have a history of at least 30 pack-years of smoking and you currently smoke or have quit within the past 15 years. A  pack-year is smoking an average of one pack of cigarettes per day for one year.  Yearly screening should:  Continue until it has been 15 years since you quit.  Stop if you develop a health problem that would prevent you from having lung cancer treatment.  Colorectal Cancer  This type of cancer can be detected and can often be prevented.  Routine colorectal cancer screening usually begins at age 27 and continues through age 37.  If you have risk factors for colon cancer, your health care provider may recommend that you be screened at an earlier age.  If you have a family history of colorectal cancer, talk with your health care provider about genetic  screening.  Your health care provider may also recommend using home test kits to check for hidden blood in your stool.  A small camera at the end of a tube can be used to examine your colon directly (sigmoidoscopy or colonoscopy). This is done to check for the earliest forms of colorectal cancer.  Direct examination of the colon should be repeated every 5-10 years until age 52. However, if early forms of precancerous polyps or small growths are found or if you have a family history or genetic risk for colorectal cancer, you may need to be screened more often.  Skin Cancer  Check your skin from head to toe regularly.  Monitor any moles. Be sure to tell your health care provider: ? About any new moles or changes in moles, especially if there is a change in a mole's shape or color. ? If you have a mole that is larger than the size of a pencil eraser.  If any of your family members has a history of skin cancer, especially at a young age, talk with your health care provider about genetic screening.  Always use sunscreen. Apply sunscreen liberally and repeatedly throughout the day.  Whenever you are outside, protect yourself by wearing long sleeves, pants, a wide-brimmed hat, and sunglasses.  What should I know about osteoporosis? Osteoporosis is a condition in which bone destruction happens more quickly than new bone creation. After menopause, you may be at an increased risk for osteoporosis. To help prevent osteoporosis or the bone fractures that can happen because of osteoporosis, the following is recommended:  If you are 95-53 years old, get at least 1,000 mg of calcium and at least 600 mg of vitamin D per day.  If you are older than age 38 but younger than age 23, get at least 1,200 mg of calcium and at least 600 mg of vitamin D per day.  If you are older than age 85, get at least 1,200 mg of calcium and at least 800 mg of vitamin D per day.  Smoking and excessive alcohol intake  increase the risk of osteoporosis. Eat foods that are rich in calcium and vitamin D, and do weight-bearing exercises several times each week as directed by your health care provider. What should I know about how menopause affects my mental health? Depression may occur at any age, but it is more common as you become older. Common symptoms of depression include:  Low or sad mood.  Changes in sleep patterns.  Changes in appetite or eating patterns.  Feeling an overall lack of motivation or enjoyment of activities that you previously enjoyed.  Frequent crying spells.  Talk with your health care provider if you think that you are experiencing depression. What should I know about immunizations? It is important that you get  and maintain your immunizations. These include:  Tetanus, diphtheria, and pertussis (Tdap) booster vaccine.  Influenza every year before the flu season begins.  Pneumonia vaccine.  Shingles vaccine.  Your health care provider may also recommend other immunizations. This information is not intended to replace advice given to you by your health care provider. Make sure you discuss any questions you have with your health care provider. Document Released: 08/13/2005 Document Revised: 01/09/2016 Document Reviewed: 03/25/2015 Elsevier Interactive Patient Education  2018 [employer].

## 2017-04-28 DIAGNOSIS — I361 Nonrheumatic tricuspid (valve) insufficiency: Secondary | ICD-10-CM | POA: Insufficient documentation

## 2017-05-24 ENCOUNTER — Ambulatory Visit
Admission: RE | Admit: 2017-05-24 | Discharge: 2017-05-24 | Disposition: A | Discharge status: Home / Self Care | Payer: BLUE CROSS/BLUE SHIELD | Source: Ambulatory Visit | Attending: Obstetrics and Gynecology | Admitting: Obstetrics and Gynecology

## 2017-05-24 DIAGNOSIS — Z1239 Encounter for other screening for malignant neoplasm of breast: Secondary | ICD-10-CM

## 2017-05-24 DIAGNOSIS — Z1231 Encounter for screening mammogram for malignant neoplasm of breast: Secondary | ICD-10-CM | POA: Insufficient documentation

## 2018-04-20 ENCOUNTER — Other Ambulatory Visit: Payer: Self-pay | Admitting: Internal Medicine

## 2018-04-20 DIAGNOSIS — Z1231 Encounter for screening mammogram for malignant neoplasm of breast: Secondary | ICD-10-CM

## 2018-04-27 ENCOUNTER — Encounter: Payer: BLUE CROSS/BLUE SHIELD | Admitting: Obstetrics and Gynecology

## 2018-05-11 ENCOUNTER — Ambulatory Visit (INDEPENDENT_AMBULATORY_CARE_PROVIDER_SITE_OTHER): Payer: BLUE CROSS/BLUE SHIELD | Admitting: Obstetrics and Gynecology

## 2018-05-11 ENCOUNTER — Encounter: Payer: Self-pay | Admitting: Obstetrics and Gynecology

## 2018-05-11 VITALS — BP 110/69 | HR 60 | Ht 62.0 in | Wt 184.4 lb

## 2018-05-11 DIAGNOSIS — Z01419 Encounter for gynecological examination (general) (routine) without abnormal findings: Secondary | ICD-10-CM | POA: Diagnosis not present

## 2018-05-11 DIAGNOSIS — Z95810 Presence of automatic (implantable) cardiac defibrillator: Secondary | ICD-10-CM

## 2018-05-11 DIAGNOSIS — M8588 Other specified disorders of bone density and structure, other site: Secondary | ICD-10-CM

## 2018-05-11 DIAGNOSIS — Z780 Asymptomatic menopausal state: Secondary | ICD-10-CM

## 2018-05-11 DIAGNOSIS — N950 Postmenopausal bleeding: Secondary | ICD-10-CM | POA: Diagnosis not present

## 2018-05-11 DIAGNOSIS — Z7901 Long term (current) use of anticoagulants: Secondary | ICD-10-CM

## 2018-05-11 NOTE — Progress Notes (Signed)
ANNUAL PREVENTATIVE CARE GYN  ENCOUNTER NOTE  Subjective:       Evelyn Reese is a 57 y.o. G24P2002 female here for a routine annual gynecologic exam.  Current complaints: 1.   Small amount of blood after IC; patient has noted some spotting immediately after intercourse over the past 4 months.  She is not experiencing any pelvic pain.  She is not experiencing any atypical vaginal discharge. 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  She does have easy bruisability.   Significant interval health issues include identification of aortic aneurysm and heart valve dysfunction requiring surgical repair with placement of artificial heart valve.  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  Her defibrillator did go off in March of this year because of a tacky arrhythmia.  Bowel and bladder function are normal. Patient notes occasional vasomotor symptom. Patient remains sexually active and uses lubricants to help with dryness.   Gynecologic History Patient's last menstrual period was 09/16/2014 (approximate). Contraception: post menopausal status Last Pap: 04/22/2016 neg/neg. Last mammogram: 05/24/2017 BI-RADS 1  Obstetric History OB History  Gravida Para Term Preterm AB Living  2 2 2     2   SAB TAB Ectopic Multiple Live Births          2    # Outcome Date GA Lbr Len/2nd Weight Sex Delivery Anes PTL Lv  2 Term      Vag-Spont   LIV  1 Term      Vag-Spont   LIV    Past Medical History:  Diagnosis Date  . Dysplasia of cervix   . Genital herpes   . Hypercholesterolemia   . IBS (irritable bowel syndrome)   . Increased BMI   . LBBB (left bundle branch block)   . Menopause   . Osteopenia   . PMB (postmenopausal bleeding)   . Vitamin D deficiency     Past Surgical History:  Procedure Laterality Date  . BREAST BIOPSY Left    neg  . CARDIAC SURGERY    . DIAGNOSTIC LAPAROSCOPY    . LEEP    . nodule removed  Left 1980   breast  . pace maker    . TONSILLECTOMY  1972  . TUBAL LIGATION  2002    Current Outpatient Medications on File Prior to Visit  Medication Sig Dispense Refill  . clopidogrel (PLAVIX) 75 MG tablet TAKE 1 TABLET BY MOUTH ONCE DAILY.    . furosemide (LASIX) 40 MG tablet Take by mouth.    . losartan (COZAAR) 25 MG tablet TAKE 1 TABLET BY MOUTH ONCE DAILY.    . metoprolol succinate (TOPROL-XL) 50 MG 24 hr tablet Take by mouth.    . metoprolol tartrate (LOPRESSOR) 25 MG tablet Take by mouth.    . potassium chloride (K-DUR) 10 MEQ tablet Take by mouth.    . rosuvastatin (CRESTOR) 10 MG tablet Take by mouth.    . spironolactone (ALDACTONE) 25 MG tablet Take by mouth.    . warfarin (COUMADIN) 1 MG tablet Resume when and as directed by your out patient provider to maintain an INR of 2 - 3 for mechAoVR    . warfarin (COUMADIN) 4 MG tablet Take 2 mg by mouth on the night of 7.21.2018 and 4 mg on the night of 7.22.2018. Upon repeat INR check on Monday 7.23.2018 you will then resume Warfarin regimen when and as directed by your out patient provider to maintain an INR of 2 - 3  for mechAoVR     No current facility-administered medications on file prior to visit.     Allergies  Allergen Reactions  . Lisinopril Swelling  . Penicillins Rash    Social History   Socioeconomic History  . Marital status: Married    Spouse name: Not on file  . Number of children: Not on file  . Years of education: Not on file  . Highest education level: Not on file  Occupational History  . Not on file  Social Needs  . Financial resource strain: Not on file  . Food insecurity:    Worry: Not on file    Inability: Not on file  . Transportation needs:    Medical: Not on file    Non-medical: Not on file  Tobacco Use  . Smoking status: Never Smoker  . Smokeless tobacco: Never Used  Substance and Sexual Activity  . Alcohol use: No    Alcohol/week: 1.0 standard drinks    Types: 1 Standard drinks or  equivalent per week    Comment: weekly  . Drug use: No  . Sexual activity: Yes    Partners: Male    Birth control/protection: Post-menopausal  Lifestyle  . Physical activity:    Days per week: Not on file    Minutes per session: Not on file  . Stress: Not on file  Relationships  . Social connections:    Talks on phone: Not on file    Gets together: Not on file    Attends religious service: Not on file    Active member of club or organization: Not on file    Attends meetings of clubs or organizations: Not on file    Relationship status: Not on file  . Intimate partner violence:    Fear of current or ex partner: Not on file    Emotionally abused: Not on file    Physically abused: Not on file    Forced sexual activity: Not on file  Other Topics Concern  . Not on file  Social History Narrative  . Not on file    Family History  Problem Relation Age of Onset  . Lymphoma Mother   . Hypertension Father   . Arthritis Father   . Heart disease Brother   . Breast cancer Neg Hx   . Ovarian cancer Neg Hx   . Colon cancer Neg Hx   . Diabetes Neg Hx     The following portions of the patient's history were reviewed and updated as appropriate: allergies, current medications, past family history, past medical history, past social history, past surgical history and problem list.  Review of Systems Review of Systems  Constitutional: Negative for malaise/fatigue and weight loss.       No vasomotor symptoms  Respiratory: Negative.   Cardiovascular: Negative.   Gastrointestinal: Negative for abdominal pain, blood in stool, constipation, diarrhea, nausea and vomiting.  Genitourinary: Negative for dysuria and frequency.  Skin: Negative.   All other systems reviewed and are negative.     Objective:   BP 110/69   Pulse 60   Ht 5\' 2"  (1.575 m)   Wt 184 lb 6.4 oz (83.6 kg)   LMP 09/16/2014 (Approximate)   BMI 33.73 kg/m  CONSTITUTIONAL: Well-developed, well-nourished female in no  acute distress.  PSYCHIATRIC: Normal mood and affect. Normal behavior. Normal judgment and thought content. NEUROLGIC: Alert and oriented to person, place, and time. Normal muscle tone coordination. No cranial nerve deficit noted. HENT:  Normocephalic, atraumatic,o EYES: Conjunctivae  and EOM are normal.  No scleral icterus.  NECK: Normal range of motion, supple, no masses.  Normal thyroid.  SKIN: Skin is warm and dry. No rash noted. Not diaphoretic. No erythema. No pallor. CARDIOVASCULAR: Normal heart rate noted, regular rhythm, no murmur; mechanical heart valve click is audible.  Thoracotomy scar is healed; automatic defibrillator/pacemaker implant scar is well-healed; minimal left chest wall tenderness RESPIRATORY: Clear to auscultation bilaterally. Effort and breath sounds normal, no problems with respiration noted. BREASTS: Symmetric in size. No masses, skin changes, nipple drainage, or lymphadenopathy. ABDOMEN: Soft, normal bowel sounds, no distention noted.  No tenderness, rebound or guarding.  BLADDER: Normal PELVIC:  External Genitalia: Normal  BUS: Normal  Vagina: Severe atrophy; no evidence of abrasion or ulceration; good support  Cervix: Scarred; os is not visible; no cervical motion tenderness  Uterus: Normal; small, mobile, midplane  Adnexa: Normal; nonpalpable and nontender  RV: External Exam NormaI, No Rectal Masses and Normal Sphincter tone  MUSCULOSKELETAL: Normal range of motion. No tenderness.  No cyanosis, clubbing, or edema.  2+ distal pulses. LYMPHATIC: No Axillary, Supraclavicular, or Inguinal Adenopathy.    Assessment:   Annual gynecologic examination 57 y.o. Contraception: post menopausal status [id number] Vaginal atrophy, severe Postcoital bleeding, no obvious source Cervical os stenosis/scarred following LEEP cone biopsy of the cervix Plan:  Pap: Due 2020 Mammogram: Scheduled for 05/29/2018 Stool Guaiac Testing: thru pcp Labs: thru pcp Routine preventative  health maintenance measures emphasized: Exercise/Diet/Weight control, Tobacco Warnings and Alcohol/Substance use risks  Pelvic ultrasound; results will be made available along with further management planning Maintain menstrual calendar monitoring for any abnormal spotting Return to Clinic - 1 Year-Dr. Kern Alberta, CMA  I Hulan Saas  am acting as a Neurosurgeon for [employer]. I reviewed, updated, and concur with the information scribed by Darol Destine, CMA Herold Harms, MD  Note: This dictation was prepared with Dragon dictation along with smaller phrase technology. Any transcriptional errors that result from this process are unintentional.

## 2018-05-11 NOTE — Patient Instructions (Addendum)
1.  Pap smear is not done.  Next Pap smear is due 2020. 2.  Mammogram is scheduled for 05/29/2018. 3.  Stool guaiac card testing for colon cancer screening is done through primary care 4.  Screening labs are done through primary care. 5.  Continue with healthy eating and exercise. 6.  Recommend calcium 600 mg twice a day and vitamin D 400 international units twice a day 7.  Pelvic ultrasound is ordered to assess postmenopausal bleeding.  Results will be made available along with further management planning 7.  Return in 1 year for annual exam-Dr. [place] Maintenance for Postmenopausal Women Menopause is a normal process in which your reproductive ability comes to an end. This process happens gradually over a span of months to years, usually between the ages of 74 and 8. Menopause is complete when you have missed 12 consecutive menstrual periods. It is important to talk with your health care provider about some of the most common conditions that affect postmenopausal women, such as heart disease, cancer, and bone loss (osteoporosis). Adopting a healthy lifestyle and getting preventive care can help to promote your health and wellness. Those actions can also lower your chances of developing some of these common conditions. What should I know about menopause? During menopause, you may experience a number of symptoms, such as:  Moderate-to-severe hot flashes.  Night sweats.  Decrease in sex drive.  Mood swings.  Headaches.  Tiredness.  Irritability.  Memory problems.  Insomnia.  Choosing to treat or not to treat menopausal changes is an individual decision that you make with your health care provider. What should I know about hormone replacement therapy and supplements? Hormone therapy products are effective for treating symptoms that are associated with menopause, such as hot flashes and night sweats. Hormone replacement carries certain risks, especially as you become older.  If you are thinking about using estrogen or estrogen with progestin treatments, discuss the benefits and risks with your health care provider. What should I know about heart disease and stroke? Heart disease, heart attack, and stroke become more likely as you age. This may be due, in part, to the hormonal changes that your body experiences during menopause. These can affect how your body processes dietary fats, triglycerides, and cholesterol. Heart attack and stroke are both medical emergencies. There are many things that you can do to help prevent heart disease and stroke:  Have your blood pressure checked at least every 1-2 years. High blood pressure causes heart disease and increases the risk of stroke.  If you are 57-21 years old, ask your health care provider if you should take aspirin to prevent a heart attack or a stroke.  Do not use any tobacco products, including cigarettes, chewing tobacco, or electronic cigarettes. If you need help quitting, ask your health care provider.  It is important to eat a healthy diet and maintain a healthy weight. ? Be sure to include plenty of vegetables, fruits, low-fat dairy products, and lean protein. ? Avoid eating foods that are high in solid fats, added sugars, or salt (sodium).  Get regular exercise. This is one of the most important things that you can do for your health. ? Try to exercise for at least 150 minutes each week. The type of exercise that you do should increase your heart rate and make you sweat. This is known as moderate-intensity exercise. ? Try to do strengthening exercises at least twice each week. Do these in addition to the moderate-intensity exercise.  Know your numbers.Ask your health care provider to check your cholesterol and your blood glucose. Continue to have your blood tested as directed by your health care provider.  What should I know about cancer screening? There are several types of cancer. Take the following steps to  reduce your risk and to catch any cancer development as early as possible. Breast Cancer  Practice breast self-awareness. ? This means understanding how your breasts normally appear and feel. ? It also means doing regular breast self-exams. Let your health care provider know about any changes, no matter how small.  If you are 28 or older, have a clinician do a breast exam (clinical breast exam or CBE) every year. Depending on your age, family history, and medical history, it may be recommended that you also have a yearly breast X-ray (mammogram).  If you have a family history of breast cancer, talk with your health care provider about genetic screening.  If you are at high risk for breast cancer, talk with your health care provider about having an MRI and a mammogram every year.  Breast cancer (BRCA) gene test is recommended for women who have family members with BRCA-related cancers. Results of the assessment will determine the need for genetic counseling and BRCA1 and for BRCA2 testing. BRCA-related cancers include these types: ? Breast. This occurs in males or females. ? Ovarian. ? Tubal. This may also be called fallopian tube cancer. ? Cancer of the abdominal or pelvic lining (peritoneal cancer). ? Prostate. ? Pancreatic.  Cervical, Uterine, and Ovarian Cancer Your health care provider may recommend that you be screened regularly for cancer of the pelvic organs. These include your ovaries, uterus, and vagina. This screening involves a pelvic exam, which includes checking for microscopic changes to the surface of your cervix (Pap test).  For women ages 21-65, health care providers may recommend a pelvic exam and a Pap test every three years. For women ages 24-65, they may recommend the Pap test and pelvic exam, combined with testing for human papilloma virus (HPV), every five years. Some types of HPV increase your risk of cervical cancer. Testing for HPV may also be done on women of any  age who have unclear Pap test results.  Other health care providers may not recommend any screening for nonpregnant women who are considered low risk for pelvic cancer and have no symptoms. Ask your health care provider if a screening pelvic exam is right for you.  If you have had past treatment for cervical cancer or a condition that could lead to cancer, you need Pap tests and screening for cancer for at least 20 years after your treatment. If Pap tests have been discontinued for you, your risk factors (such as having a new sexual partner) need to be reassessed to determine if you should start having screenings again. Some women have medical problems that increase the chance of getting cervical cancer. In these cases, your health care provider may recommend that you have screening and Pap tests more often.  If you have a family history of uterine cancer or ovarian cancer, talk with your health care provider about genetic screening.  If you have vaginal bleeding after reaching menopause, tell your health care provider.  There are currently no reliable tests available to screen for ovarian cancer.  Lung Cancer Lung cancer screening is recommended for adults 73-60 years old who are at high risk for lung cancer because of a history of smoking. A yearly low-dose CT scan of the  lungs is recommended if you:  Currently smoke.  Have a history of at least 30 pack-years of smoking and you currently smoke or have quit within the past 15 years. A pack-year is smoking an average of one pack of cigarettes per day for one year.  Yearly screening should:  Continue until it has been 15 years since you quit.  Stop if you develop a health problem that would prevent you from having lung cancer treatment.  Colorectal Cancer  This type of cancer can be detected and can often be prevented.  Routine colorectal cancer screening usually begins at age 38 and continues through age 14.  If you have risk factors  for colon cancer, your health care provider may recommend that you be screened at an earlier age.  If you have a family history of colorectal cancer, talk with your health care provider about genetic screening.  Your health care provider may also recommend using home test kits to check for hidden blood in your stool.  A small camera at the end of a tube can be used to examine your colon directly (sigmoidoscopy or colonoscopy). This is done to check for the earliest forms of colorectal cancer.  Direct examination of the colon should be repeated every 5-10 years until age 39. However, if early forms of precancerous polyps or small growths are found or if you have a family history or genetic risk for colorectal cancer, you may need to be screened more often.  Skin Cancer  Check your skin from head to toe regularly.  Monitor any moles. Be sure to tell your health care provider: ? About any new moles or changes in moles, especially if there is a change in a mole's shape or color. ? If you have a mole that is larger than the size of a pencil eraser.  If any of your family members has a history of skin cancer, especially at a young age, talk with your health care provider about genetic screening.  Always use sunscreen. Apply sunscreen liberally and repeatedly throughout the day.  Whenever you are outside, protect yourself by wearing long sleeves, pants, a wide-brimmed hat, and sunglasses.  What should I know about osteoporosis? Osteoporosis is a condition in which bone destruction happens more quickly than new bone creation. After menopause, you may be at an increased risk for osteoporosis. To help prevent osteoporosis or the bone fractures that can happen because of osteoporosis, the following is recommended:  If you are 26-79 years old, get at least 1,000 mg of calcium and at least 600 mg of vitamin D per day.  If you are older than age 35 but younger than age 60, get at least 1,200 mg of  calcium and at least 600 mg of vitamin D per day.  If you are older than age 40, get at least 1,200 mg of calcium and at least 800 mg of vitamin D per day.  Smoking and excessive alcohol intake increase the risk of osteoporosis. Eat foods that are rich in calcium and vitamin D, and do weight-bearing exercises several times each week as directed by your health care provider. What should I know about how menopause affects my mental health? Depression may occur at any age, but it is more common as you become older. Common symptoms of depression include:  Low or sad mood.  Changes in sleep patterns.  Changes in appetite or eating patterns.  Feeling an overall lack of motivation or enjoyment of activities that you previously  enjoyed.  Frequent crying spells.  Talk with your health care provider if you think that you are experiencing depression. What should I know about immunizations? It is important that you get and maintain your immunizations. These include:  Tetanus, diphtheria, and pertussis (Tdap) booster vaccine.  Influenza every year before the flu season begins.  Pneumonia vaccine.  Shingles vaccine.  Your health care provider may also recommend other immunizations. This information is not intended to replace advice given to you by your health care provider. Make sure you discuss any questions you have with your health care provider. Document Released: 08/13/2005 Document Revised: 01/09/2016 Document Reviewed: 03/25/2015 Elsevier Interactive Patient Education  2018 [employer].

## 2018-05-16 ENCOUNTER — Ambulatory Visit (INDEPENDENT_AMBULATORY_CARE_PROVIDER_SITE_OTHER): Payer: BLUE CROSS/BLUE SHIELD

## 2018-05-16 DIAGNOSIS — N950 Postmenopausal bleeding: Secondary | ICD-10-CM | POA: Diagnosis not present

## 2018-05-29 ENCOUNTER — Ambulatory Visit
Admission: RE | Admit: 2018-05-29 | Discharge: 2018-05-29 | Disposition: A | Discharge status: Home / Self Care | Payer: BLUE CROSS/BLUE SHIELD | Source: Ambulatory Visit | Attending: Internal Medicine | Admitting: Internal Medicine

## 2018-05-29 DIAGNOSIS — Z1231 Encounter for screening mammogram for malignant neoplasm of breast: Secondary | ICD-10-CM | POA: Diagnosis not present

## 2018-07-05 DIAGNOSIS — I4892 Unspecified atrial flutter: Secondary | ICD-10-CM | POA: Insufficient documentation

## 2018-12-26 DIAGNOSIS — Z8679 Personal history of other diseases of the circulatory system: Secondary | ICD-10-CM | POA: Insufficient documentation

## 2019-04-20 ENCOUNTER — Other Ambulatory Visit: Payer: Self-pay | Admitting: Internal Medicine

## 2019-04-20 DIAGNOSIS — N1832 Chronic kidney disease, stage 3b: Secondary | ICD-10-CM

## 2019-04-27 ENCOUNTER — Other Ambulatory Visit: Payer: Self-pay

## 2019-04-27 ENCOUNTER — Ambulatory Visit
Admission: RE | Admit: 2019-04-27 | Discharge: 2019-04-27 | Disposition: A | Discharge status: Home / Self Care | Payer: Medicare Other | Source: Ambulatory Visit | Attending: Internal Medicine | Admitting: Internal Medicine

## 2019-04-27 DIAGNOSIS — N1832 Chronic kidney disease, stage 3b: Secondary | ICD-10-CM | POA: Insufficient documentation

## 2019-05-02 ENCOUNTER — Other Ambulatory Visit: Payer: Self-pay | Admitting: Internal Medicine

## 2019-05-02 DIAGNOSIS — Z1231 Encounter for screening mammogram for malignant neoplasm of breast: Secondary | ICD-10-CM

## 2019-05-16 ENCOUNTER — Other Ambulatory Visit (HOSPITAL_COMMUNITY)
Admission: RE | Admit: 2019-05-16 | Discharge: 2019-05-16 | Disposition: A | Discharge status: Home / Self Care | Payer: Medicare Other | Source: Ambulatory Visit | Attending: Obstetrics and Gynecology | Admitting: Obstetrics and Gynecology

## 2019-05-16 ENCOUNTER — Other Ambulatory Visit: Payer: Self-pay

## 2019-05-16 ENCOUNTER — Ambulatory Visit (INDEPENDENT_AMBULATORY_CARE_PROVIDER_SITE_OTHER): Payer: Medicare Other | Admitting: Obstetrics and Gynecology

## 2019-05-16 ENCOUNTER — Encounter: Payer: Self-pay | Admitting: Obstetrics and Gynecology

## 2019-05-16 VITALS — BP 107/72 | HR 69 | Ht 62.0 in | Wt 188.9 lb

## 2019-05-16 DIAGNOSIS — M8588 Other specified disorders of bone density and structure, other site: Secondary | ICD-10-CM

## 2019-05-16 DIAGNOSIS — Z124 Encounter for screening for malignant neoplasm of cervix: Secondary | ICD-10-CM

## 2019-05-16 DIAGNOSIS — Z1151 Encounter for screening for human papillomavirus (HPV): Secondary | ICD-10-CM | POA: Diagnosis not present

## 2019-05-16 DIAGNOSIS — Z01419 Encounter for gynecological examination (general) (routine) without abnormal findings: Secondary | ICD-10-CM | POA: Diagnosis not present

## 2019-05-16 DIAGNOSIS — Z780 Asymptomatic menopausal state: Secondary | ICD-10-CM | POA: Diagnosis not present

## 2019-05-16 DIAGNOSIS — N952 Postmenopausal atrophic vaginitis: Secondary | ICD-10-CM

## 2019-05-16 DIAGNOSIS — Z7901 Long term (current) use of anticoagulants: Secondary | ICD-10-CM

## 2019-05-16 NOTE — Progress Notes (Signed)
Pt present for annual exam. Pt stated that she was doing well no problems.  

## 2019-05-16 NOTE — Patient Instructions (Signed)
Health Maintenance for Postmenopausal Women Menopause is a normal process in which your ability to get pregnant comes to an end. This process happens slowly over many months or years, usually between the ages of 48 and 55. Menopause is complete when you have missed your menstrual periods for 12 months. It is important to talk with your health care provider about some of the most common conditions that affect women after menopause (postmenopausal women). These include heart disease, cancer, and bone loss (osteoporosis). Adopting a healthy lifestyle and getting preventive care can help to promote your health and wellness. The actions you take can also lower your chances of developing some of these common conditions. What should I know about menopause? During menopause, you may get a number of symptoms, such as:  Hot flashes. These can be moderate or severe.  Night sweats.  Decrease in sex drive.  Mood swings.  Headaches.  Tiredness.  Irritability.  Memory problems.  Insomnia. Choosing to treat or not to treat these symptoms is a decision that you make with your health care provider. Do I need hormone replacement therapy?  Hormone replacement therapy is effective in treating symptoms that are caused by menopause, such as hot flashes and night sweats.  Hormone replacement carries certain risks, especially as you become older. If you are thinking about using estrogen or estrogen with progestin, discuss the benefits and risks with your health care provider. What is my risk for heart disease and stroke? The risk of heart disease, heart attack, and stroke increases as you age. One of the causes may be a change in the body's hormones during menopause. This can affect how your body uses dietary fats, triglycerides, and cholesterol. Heart attack and stroke are medical emergencies. There are many things that you can do to help prevent heart disease and stroke. Watch your blood pressure  High  blood pressure causes heart disease and increases the risk of stroke. This is more likely to develop in people who have high blood pressure readings, are of African descent, or are overweight.  Have your blood pressure checked: ? Every 3-5 years if you are 18-39 years of age. ? Every year if you are 40 years old or older. Eat a healthy diet   Eat a diet that includes plenty of vegetables, fruits, low-fat dairy products, and lean protein.  Do not eat a lot of foods that are high in solid fats, added sugars, or sodium. Get regular exercise Get regular exercise. This is one of the most important things you can do for your health. Most adults should:  Try to exercise for at least 150 minutes each week. The exercise should increase your heart rate and make you sweat (moderate-intensity exercise).  Try to do strengthening exercises at least twice each week. Do these in addition to the moderate-intensity exercise.  Spend less time sitting. Even light physical activity can be beneficial. Other tips  Work with your health care provider to achieve or maintain a healthy weight.  Do not use any products that contain nicotine or tobacco, such as cigarettes, e-cigarettes, and chewing tobacco. If you need help quitting, ask your health care provider.  Know your numbers. Ask your health care provider to check your cholesterol and your blood sugar (glucose). Continue to have your blood tested as directed by your health care provider. Do I need screening for cancer? Depending on your health history and family history, you may need to have cancer screening at different stages of your life. This   may include screening for:  Breast cancer.  Cervical cancer.  Lung cancer.  Colorectal cancer. What is my risk for osteoporosis? After menopause, you may be at increased risk for osteoporosis. Osteoporosis is a condition in which bone destruction happens more quickly than new bone creation. To help prevent  osteoporosis or the bone fractures that can happen because of osteoporosis, you may take the following actions:  If you are 49-96 years old, get at least 1,000 mg of calcium and at least 600 mg of vitamin D per day.  If you are older than age 44 but younger than age 62, get at least 1,200 mg of calcium and at least 600 mg of vitamin D per day.  If you are older than age 5, get at least 1,200 mg of calcium and at least 800 mg of vitamin D per day. Smoking and drinking excessive alcohol increase the risk of osteoporosis. Eat foods that are rich in calcium and vitamin D, and do weight-bearing exercises several times each week as directed by your health care provider. How does menopause affect my mental health? Depression may occur at any age, but it is more common as you become older. Common symptoms of depression include:  Low or sad mood.  Changes in sleep patterns.  Changes in appetite or eating patterns.  Feeling an overall lack of motivation or enjoyment of activities that you previously enjoyed.  Frequent crying spells. Talk with your health care provider if you think that you are experiencing depression. General instructions See your health care provider for regular wellness exams and vaccines. This may include:  Scheduling regular health, dental, and eye exams.  Getting and maintaining your vaccines. These include: ? Influenza vaccine. Get this vaccine each year before the flu season begins. ? Pneumonia vaccine. ? Shingles vaccine. ? Tetanus, diphtheria, and pertussis (Tdap) booster vaccine. Your health care provider may also recommend other immunizations. Tell your health care provider if you have ever been abused or do not feel safe at home. Summary  Menopause is a normal process in which your ability to get pregnant comes to an end.  This condition causes hot flashes, night sweats, decreased interest in sex, mood swings, headaches, or lack of sleep.  Treatment for this  condition may include hormone replacement therapy.  Take actions to keep yourself healthy, including exercising regularly, eating a healthy diet, watching your weight, and checking your blood pressure and blood sugar levels.  Get screened for cancer and depression. Make sure that you are up to date with all your vaccines. This information is not intended to replace advice given to you by your health care provider. Make sure you discuss any questions you have with your health care provider. Document Released: 08/13/2005 Document Revised: 06/14/2018 Document Reviewed: 06/14/2018 Elsevier Patient Education  2020 [employer].    Atrophic Vaginitis  Atrophic vaginitis is a condition in which the tissues that line the vagina become dry and thin. This condition is most common in women who have stopped having regular menstrual periods (are in menopause). This usually starts when a woman is 69-83 years old. That is the time when a woman's estrogen levels begin to drop (decrease). Estrogen is a female hormone. It helps to keep the tissues of the vagina moist. It stimulates the vagina to produce a clear fluid that lubricates the vagina for sexual intercourse. This fluid also protects the vagina from infection. Lack of estrogen can cause the lining of the vagina to get thinner and dryer.  The vagina may also shrink in size. It may become less elastic. Atrophic vaginitis tends to get worse over time as a woman's estrogen level drops. What are the causes? This condition is caused by the normal drop in estrogen that happens around the time of menopause. What increases the risk? Certain conditions or situations may lower a woman's estrogen level, leading to a higher risk for atrophic vaginitis. You are more likely to develop this condition if:  You are taking medicines that block estrogen.  You have had your ovaries removed.  You are being treated for cancer with X-ray (radiation) or medicines  (chemotherapy).  You have given birth or are breastfeeding.  You are older than age 64.  You smoke. What are the signs or symptoms? Symptoms of this condition include:  Pain, soreness, or bleeding during sexual intercourse (dyspareunia).  Vaginal burning, irritation, or itching.  Pain or bleeding when a speculum is used in a vaginal exam (pelvic exam).  Having burning pain when passing urine.  Vaginal discharge that is brown or yellow. In some cases, there are no symptoms. How is this diagnosed? This condition is diagnosed by taking a medical history and doing a physical exam. This will include a pelvic exam that checks the vaginal tissues. Though rare, you may also have other tests, including:  A urine test.  A test that checks the acid balance in your vagina (acid balance test). How is this treated? Treatment for this condition depends on how severe your symptoms are. Treatment may include:  Using an over-the-counter vaginal lubricant before sex.  Using a long-acting vaginal moisturizer.  Using low-dose vaginal estrogen for moderate to severe symptoms that do not respond to other treatments. Options include creams, tablets, and inserts (vaginal rings). Before you use a vaginal estrogen, tell your health care provider if you have a history of: ? Breast cancer. ? Endometrial cancer. ? Blood clots. If you are not sexually active and your symptoms are very mild, you may not need treatment. Follow these instructions at home: Medicines  Take over-the-counter and prescription medicines only as told by your health care provider. Do not use herbal or alternative medicines unless your health care provider says that you can.  Use over-the-counter creams, lubricants, or moisturizers for dryness only as directed by your health care provider. General instructions  If your atrophic vaginitis is caused by menopause, discuss all of your menopause symptoms and treatment options with  your health care provider.  Do not douche.  Do not use products that can make your vagina dry. These include: ? Scented feminine sprays. ? Scented tampons. ? Scented soaps.  Vaginal intercourse can help to improve blood flow and elasticity of vaginal tissue. If it hurts to have sex, try using a lubricant or moisturizer just before having intercourse. Contact a health care provider if:  Your discharge looks different than normal.  Your vagina has an unusual smell.  You have new symptoms.  Your symptoms do not improve with treatment.  Your symptoms get worse. Summary  Atrophic vaginitis is a condition in which the tissues that line the vagina become dry and thin. It is most common in women who have stopped having regular menstrual periods (are in menopause).  Treatment options include using vaginal lubricants and low-dose vaginal estrogen.  Contact a health care provider if your vagina has an unusual smell, or if your symptoms get worse or do not improve after treatment. This information is not intended to replace advice given to you  by your health care provider. Make sure you discuss any questions you have with your health care provider. Document Released: 11/05/2014 Document Revised: 06/03/2017 Document Reviewed: 03/17/2017 Elsevier Patient Education  2020 [employer]

## 2019-05-16 NOTE — Progress Notes (Addendum)
ANNUAL PREVENTATIVE CARE GYNECOLOGY  ENCOUNTER NOTE  Subjective:       Evelyn Reese is a 58 y.o. [id number] female here for a routine annual gynecologic exam. The patient is sexually active. The patient is not taking hormone replacement therapy. Patient denies post-menopausal vaginal bleeding. The patient wears seatbelts: yes. The patient participates in regular exercise: no. Has the patient ever been transfused or tattooed?: no. The patient reports that there is not domestic violence in her life.  Current complaints: 1.  Patient reports still having vaginal dryness.  Had tried Premarin cream, then tried an oral preparation as she was also having hot flushes and night sweats but discontinued this several years ago. Only using KY-Jelly now for lubrication as needed. Notes some spotting with wiping after intercourse occasionally.  Has been worked up in the past with an ultrasound for PMB which was normal. Is also on Coumadin chronically and has easy bruising.     Gynecologic History Patient's last menstrual period was 09/16/2014 (approximate). Contraception: post menopausal status Last Pap: 04/22/2016 neg/neg.  Reports remote history of abnormal pap smears, has had LEEP x 1.  Last mammogram: 05/24/2018 BI-RADS 1 Last Colonoscopy: 7 years ago, normal. Recommend repeat in 5 years due to family history of colonoscopy. Has been doing stool cards.  Last Dexa Scan: 03/2010. Osteopenia present.    Obstetric History OB History  Gravida Para Term Preterm AB Living  2 2 2     2   SAB TAB Ectopic Multiple Live Births          2    # Outcome Date GA Lbr Len/2nd Weight Sex Delivery Anes PTL Lv  2 Term      Vag-Spont   LIV  1 Term      Vag-Spont   LIV    Past Medical History:  Diagnosis Date  . Dysplasia of cervix   . Genital herpes   . Hypercholesterolemia   . IBS (irritable bowel syndrome)   . Increased BMI   . LBBB (left bundle branch block)   . Menopause   . Osteopenia   . PMB  (postmenopausal bleeding)   . Vitamin D deficiency     Family History  Problem Relation Age of Onset  . Lymphoma Mother   . Hypertension Father   . Arthritis Father   . Heart disease Brother   . Breast cancer Neg Hx   . Ovarian cancer Neg Hx   . Colon cancer Neg Hx   . Diabetes Neg Hx     Past Surgical History:  Procedure Laterality Date  . BREAST BIOPSY Left    neg  . CARDIAC SURGERY    . DIAGNOSTIC LAPAROSCOPY    . LEEP    . nodule removed Left 1980   breast  . pace maker    . TONSILLECTOMY  1972  . TUBAL LIGATION  2002    Social History   Socioeconomic History  . Marital status: Married    Spouse name: Not on file  . Number of children: Not on file  . Years of education: Not on file  . Highest education level: Not on file  Occupational History  . Not on file  Social Needs  . Financial resource strain: Not on file  . Food insecurity    Worry: Not on file    Inability: Not on file  . Transportation needs    Medical: Not on file    Non-medical: Not on file  Tobacco Use  .  Smoking status: Never Smoker  . Smokeless tobacco: Never Used  Substance and Sexual Activity  . Alcohol use: No    Alcohol/week: 1.0 standard drinks    Types: 1 Standard drinks or equivalent per week    Comment: weekly  . Drug use: No  . Sexual activity: Yes    Partners: Male    Birth control/protection: Post-menopausal  Lifestyle  . Physical activity    Days per week: 3 days    Minutes per session: 30 min  . Stress: Not on file  Relationships  . Social Herbalist on phone: Not on file    Gets together: Not on file    Attends religious service: Not on file    Active member of club or organization: Not on file    Attends meetings of clubs or organizations: Not on file    Relationship status: Not on file  . Intimate partner violence    Fear of current or ex partner: Not on file    Emotionally abused: Not on file    Physically abused: Not on file    Forced sexual  activity: Not on file  Other Topics Concern  . Not on file  Social History Narrative  . Not on file    Current Outpatient Medications on File Prior to Visit  Medication Sig Dispense Refill  . clopidogrel (PLAVIX) 75 MG tablet TAKE 1 TABLET BY MOUTH ONCE DAILY.    . furosemide (LASIX) 40 MG tablet Take by mouth.    . losartan (COZAAR) 25 MG tablet TAKE 1 TABLET BY MOUTH ONCE DAILY.    . metoprolol succinate (TOPROL-XL) 50 MG 24 hr tablet Take 100 mg by mouth 2 (two) times daily.     . potassium chloride (K-DUR) 10 MEQ tablet Take by mouth.    . rosuvastatin (CRESTOR) 10 MG tablet Take by mouth.    . spironolactone (ALDACTONE) 25 MG tablet Take by mouth.    . warfarin (COUMADIN) 1 MG tablet Resume when and as directed by your out patient provider to maintain an INR of 2 - 3 for mechAoVR    . warfarin (COUMADIN) 4 MG tablet Take 2 mg by mouth on the night of 7.21.2018 and 4 mg on the night of 7.22.2018. Upon repeat INR check on Monday 7.23.2018 you will then resume Warfarin regimen when and as directed by your out patient provider to maintain an INR of 2 - 3 for mechAoVR    . metoprolol tartrate (LOPRESSOR) 25 MG tablet Take by mouth.     No current facility-administered medications on file prior to visit.     Allergies  Allergen Reactions  . Lisinopril Swelling  . Penicillins Rash      Review of Systems ROS Review of Systems - General ROS: negative for - chills, fatigue, fever, hot flashes, night sweats, weight gain or weight loss Psychological ROS: negative for - anxiety, decreased libido, depression, mood swings, physical abuse or sexual abuse Ophthalmic ROS: negative for - blurry vision, eye pain or loss of vision ENT ROS: negative for - headaches, hearing change, visual changes or vocal changes Allergy and Immunology ROS: negative for - hives, itchy/watery eyes or seasonal allergies Hematological and Lymphatic ROS: negative for - bleeding problems, bruising, swollen lymph  nodes or weight loss Endocrine ROS: negative for - galactorrhea, hair pattern changes, hot flashes, malaise/lethargy, mood swings, palpitations, polydipsia/polyuria, skin changes, temperature intolerance or unexpected weight changes Breast ROS: negative for - new or changing breast lumps  or nipple discharge Respiratory ROS: negative for - cough or shortness of breath Cardiovascular ROS: negative for - chest pain, irregular heartbeat, palpitations or shortness of breath Gastrointestinal ROS: no abdominal pain, change in bowel habits, or black or bloody stools Genito-Urinary ROS: no dysuria, trouble voiding, or hematuria. Positive for vaginal dryness (see HPI).  Musculoskeletal ROS: negative for - joint pain or joint stiffness Neurological ROS: negative for - bowel and bladder control changes Dermatological ROS: negative for rash and skin lesion changes   Objective:   BP 107/72   Pulse 69   Ht  (1.575 m)   Wt 188 lb 14.4 oz (85.7 kg)   LMP 09/16/2014 (Approximate)   BMI 34.55 kg/m  CONSTITUTIONAL: Well-developed, well-nourished female in no acute distress.  PSYCHIATRIC: Normal mood and affect. Normal behavior. Normal judgment and thought content. NEUROLGIC: Alert and oriented to person, place, and time. Normal muscle tone coordination. No cranial nerve deficit noted. HENT:  Normocephalic, atraumatic, External right and left ear normal. Oropharynx is clear and moist EYES: Conjunctivae and EOM are normal. Pupils are equal, round, and reactive to light. No scleral icterus.  NECK: Normal range of motion, supple, no masses.  Normal thyroid.  SKIN: Skin is warm and dry. No rash noted. Not diaphoretic. No erythema. No pallor. CARDIOVASCULAR: Normal heart rate noted, regular rhythm, no murmur. RESPIRATORY: Clear to auscultation bilaterally. Effort and breath sounds normal, no problems with respiration noted. BREASTS: Symmetric in size. No masses, skin changes, nipple drainage, or  lymphadenopathy. ABDOMEN: Soft, normal bowel sounds, no distention noted.  No tenderness, rebound or guarding.  BLADDER: Normal PELVIC:  Bladder no bladder distension noted  Urethra: normal appearing urethra with no masses, tenderness or lesions  Vulva: normal appearing vulva with no masses, tenderness or lesions  Vagina: moderate to severe atrophy. No discharge or lesions.   Cervix: Scarred; os is not visible; no cervical motion tenderness  Uterus: uterus is normal size, shape, consistency and nontender  Adnexa: normal adnexa in size, nontender and no masses  RV: External Exam NormaI, No Rectal Masses and Normal Sphincter tone  MUSCULOSKELETAL: Normal range of motion. No tenderness.  No cyanosis, clubbing, or edema.  2+ distal pulses. LYMPHATIC: No Axillary, Supraclavicular, or Inguinal Adenopathy.   Labs: No results found for: WBC, HGB, HCT, MCV, PLT  No results found for: CREATININE, BUN, NA, K, CL, CO2  No results found for: ALT, AST, GGT, ALKPHOS, BILITOT  No results found for: CHOL, HDL, LDLCALC, LDLDIRECT, TRIG, CHOLHDL  No results found for: TSH  No results found for: HGBA1C   Assessment:   1. Encounter for well woman exam with routine gynecological exam   2. Menopause   3. Anticoagulated on warfarin   4. Osteopenia of other site   5. Vaginal atrophy     Plan:  Pap: Pap Co Test done today.  Mammogram: Scheduled. Stool Guaiac Testing:  Ordered by PCP  Labs: drawn by PCP.  Routine preventative health maintenance measures emphasized: Exercise/Diet/Weight control, Tobacco Warnings, Alcohol/Substance use risks, Stress Management, Peer Pressure Issues and Safe Sex  Encourage VItamin D and Calcium supplementation Flu vaccine received in October.  Discussed management options again for vaginal atrophy including estrogen tablets or ring (declines use of cream), Intrarosa, or different lubricant. Given samples of UberLube and also info on Joe's H20. Patient notes she will  try lubricants first, but may consider Intrarosa in the future.  Return to Clinic - 1 Year for annual exam. Sooner if she desires trial of  Intrarosa.    Hildred LaserAnika Genoveva Singleton, MD  [facility]

## 2019-05-17 ENCOUNTER — Encounter: Payer: Self-pay | Admitting: Obstetrics and Gynecology

## 2019-05-21 LAB — CYTOLOGY - PAP
Comment: NEGATIVE
Diagnosis: NEGATIVE
High risk HPV: NEGATIVE

## 2019-06-05 ENCOUNTER — Ambulatory Visit
Admission: RE | Admit: 2019-06-05 | Discharge: 2019-06-05 | Disposition: A | Discharge status: Home / Self Care | Payer: Medicare Other | Source: Ambulatory Visit | Attending: Internal Medicine | Admitting: Internal Medicine

## 2019-06-05 DIAGNOSIS — Z1231 Encounter for screening mammogram for malignant neoplasm of breast: Secondary | ICD-10-CM | POA: Diagnosis not present

## 2019-06-12 DIAGNOSIS — N1832 Chronic kidney disease, stage 3b: Secondary | ICD-10-CM | POA: Insufficient documentation

## 2019-07-23 IMAGING — MG DIGITAL SCREENING BILATERAL MAMMOGRAM WITH TOMO AND CAD
6 of 10 series · 6 of 30 positions shown · non-contrast
Comparison: Previous exam(s).

CLINICAL DATA: Screening.

EXAM:
DIGITAL SCREENING BILATERAL MAMMOGRAM WITH TOMO AND CAD

[R MLO synth-2D]
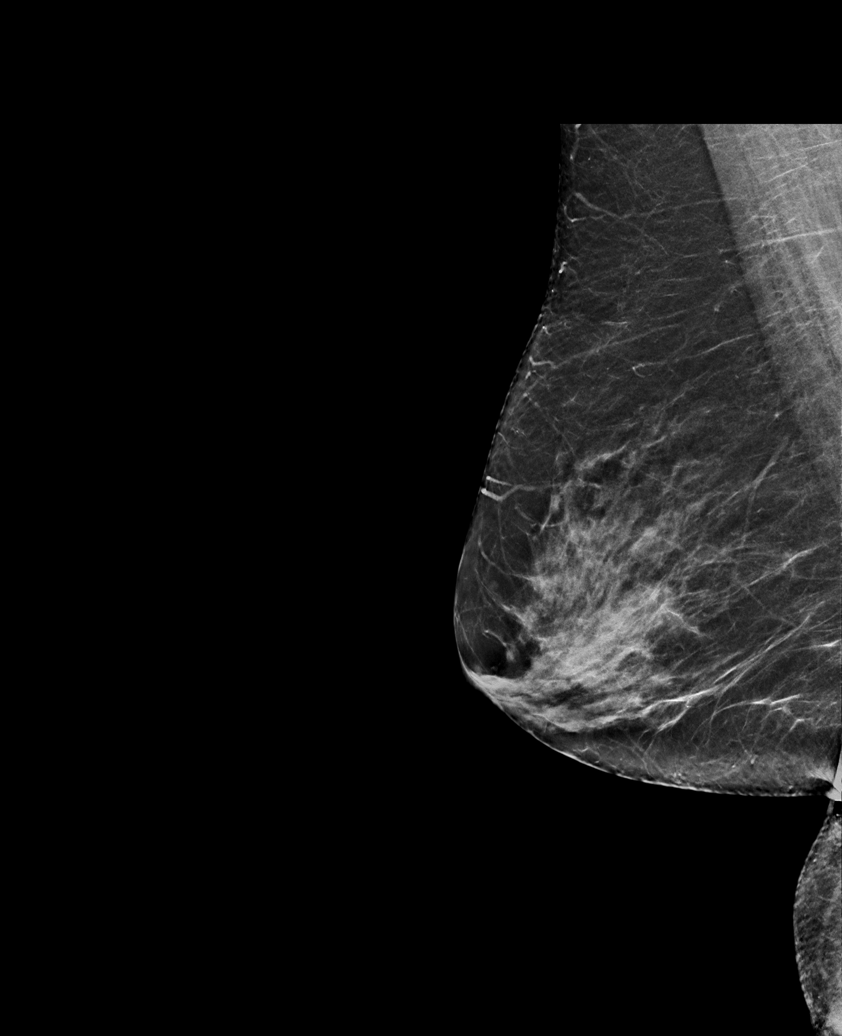

[L MLO synth-2D (1 of 2)]
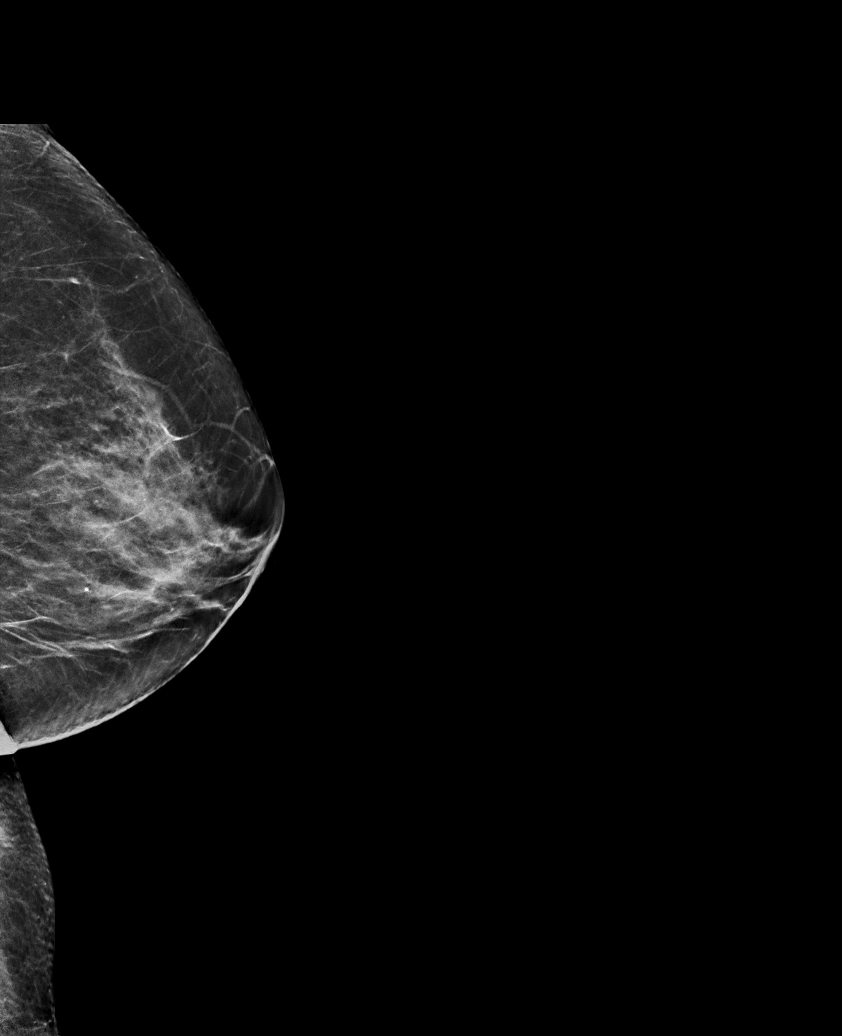

[L MLO synth-2D (2 of 2)]
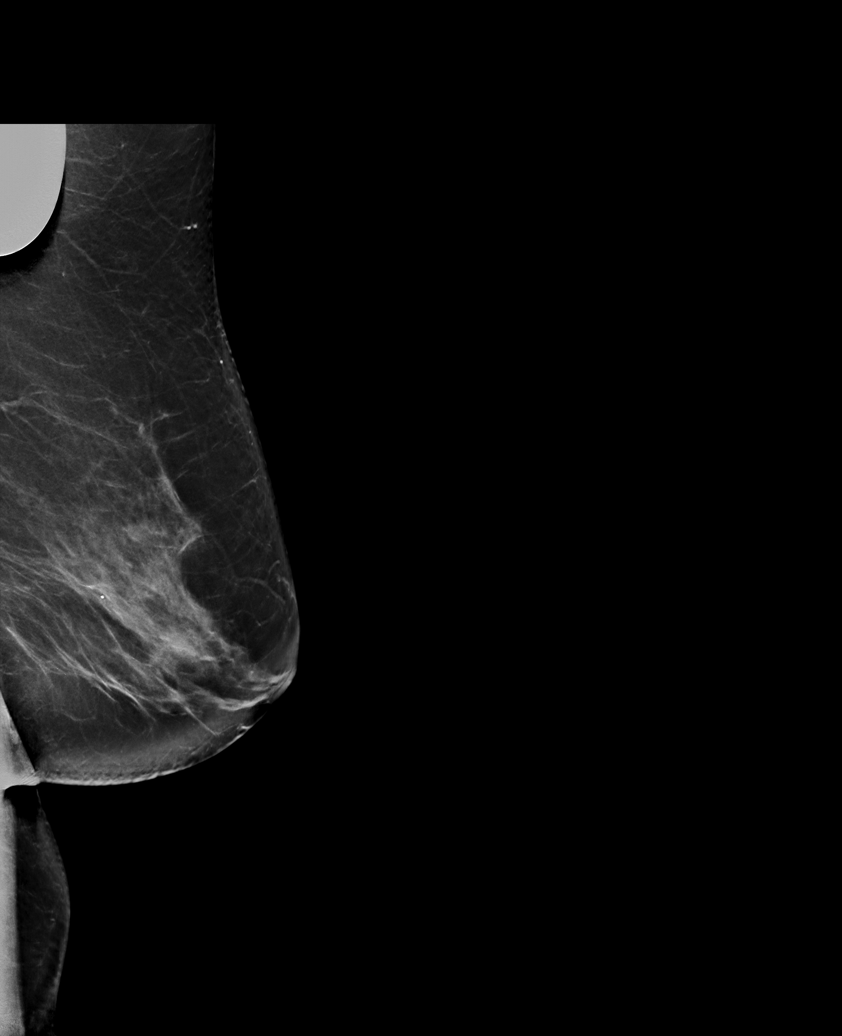

[R CC synth-2D]
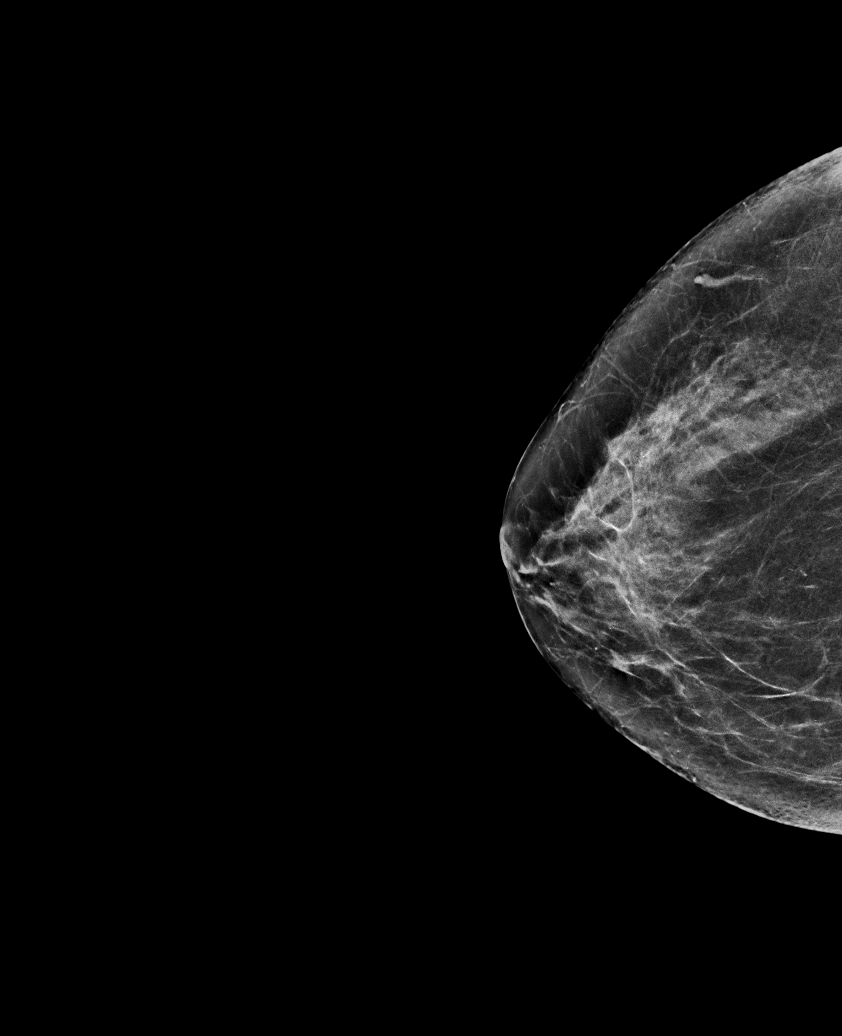

[L CC synth-2D]
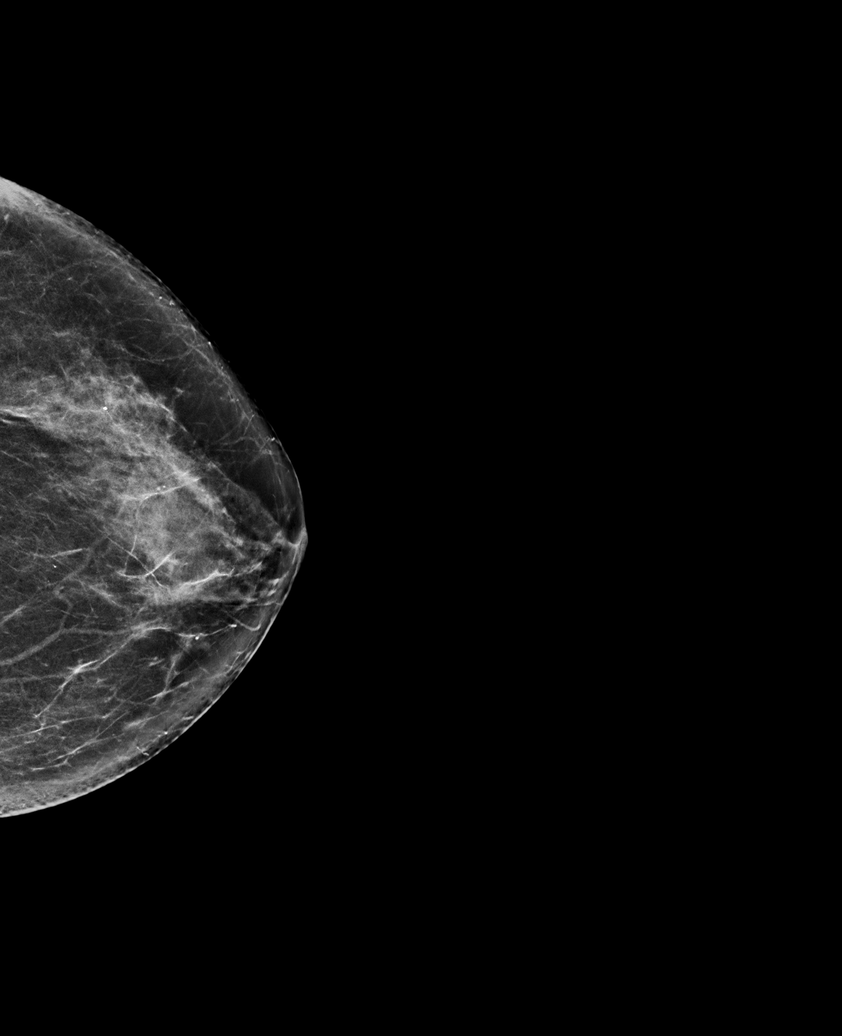

[R CC tomo · tomo slice 31/62.0]
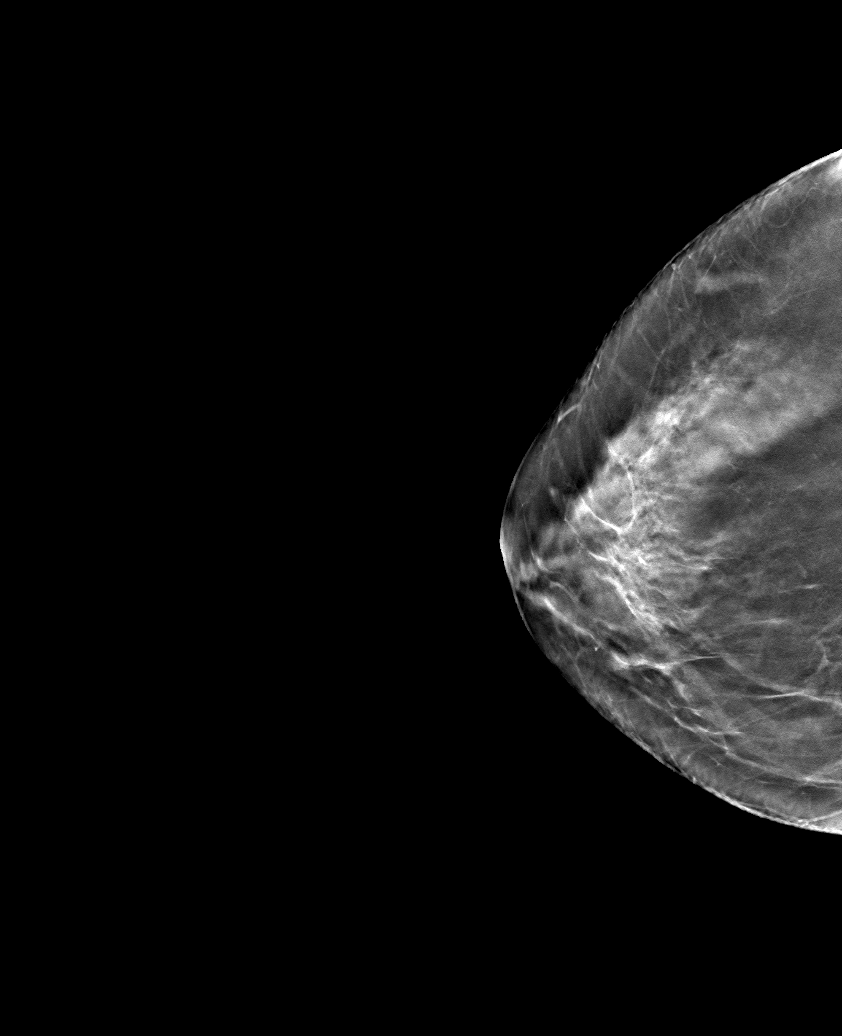

[6 of 30 positions shown; findings below may reference images not displayed]

ACR Breast Density Category c: The breast tissue is heterogeneously
dense, which may obscure small masses.
FINDINGS: There are no findings suspicious for malignancy. Images were
processed with CAD.
IMPRESSION: No mammographic evidence of malignancy. A result letter of this
screening mammogram will be mailed directly to the patient.

RECOMMENDATION:
Screening mammogram in one year. (Code:[id number])

BI-RADS CATEGORY  1: Negative.

## 2019-08-20 DIAGNOSIS — D638 Anemia in other chronic diseases classified elsewhere: Secondary | ICD-10-CM | POA: Insufficient documentation

## 2020-02-07 DIAGNOSIS — Z5181 Encounter for therapeutic drug level monitoring: Secondary | ICD-10-CM | POA: Insufficient documentation

## 2020-02-07 DIAGNOSIS — Z7901 Long term (current) use of anticoagulants: Secondary | ICD-10-CM | POA: Insufficient documentation

## 2020-02-18 DIAGNOSIS — I10 Essential (primary) hypertension: Secondary | ICD-10-CM | POA: Insufficient documentation

## 2020-04-23 ENCOUNTER — Other Ambulatory Visit: Payer: Self-pay | Admitting: Internal Medicine

## 2020-04-23 DIAGNOSIS — Z1231 Encounter for screening mammogram for malignant neoplasm of breast: Secondary | ICD-10-CM

## 2020-05-20 ENCOUNTER — Encounter: Payer: Medicare Other | Admitting: Obstetrics and Gynecology

## 2020-06-10 ENCOUNTER — Ambulatory Visit
Admission: RE | Admit: 2020-06-10 | Discharge: 2020-06-10 | Disposition: A | Discharge status: Home / Self Care | Payer: Medicare Other | Source: Ambulatory Visit | Attending: Internal Medicine | Admitting: Internal Medicine

## 2020-06-10 ENCOUNTER — Other Ambulatory Visit: Payer: Self-pay

## 2020-06-10 DIAGNOSIS — Z1231 Encounter for screening mammogram for malignant neoplasm of breast: Secondary | ICD-10-CM | POA: Diagnosis present

## 2020-08-27 ENCOUNTER — Encounter: Payer: Self-pay | Admitting: Obstetrics and Gynecology

## 2020-08-27 ENCOUNTER — Other Ambulatory Visit: Payer: Self-pay

## 2020-08-27 ENCOUNTER — Ambulatory Visit (INDEPENDENT_AMBULATORY_CARE_PROVIDER_SITE_OTHER): Payer: Medicare Other | Admitting: Obstetrics and Gynecology

## 2020-08-27 VITALS — BP 88/57 | HR 67 | Ht 62.0 in | Wt 189.3 lb

## 2020-08-27 DIAGNOSIS — Z780 Asymptomatic menopausal state: Secondary | ICD-10-CM | POA: Diagnosis not present

## 2020-08-27 DIAGNOSIS — Z1159 Encounter for screening for other viral diseases: Secondary | ICD-10-CM

## 2020-08-27 DIAGNOSIS — Z8616 Personal history of COVID-19: Secondary | ICD-10-CM

## 2020-08-27 DIAGNOSIS — M8588 Other specified disorders of bone density and structure, other site: Secondary | ICD-10-CM

## 2020-08-27 DIAGNOSIS — Z01419 Encounter for gynecological examination (general) (routine) without abnormal findings: Secondary | ICD-10-CM

## 2020-08-27 DIAGNOSIS — Z7901 Long term (current) use of anticoagulants: Secondary | ICD-10-CM

## 2020-08-27 DIAGNOSIS — N952 Postmenopausal atrophic vaginitis: Secondary | ICD-10-CM | POA: Diagnosis not present

## 2020-08-27 NOTE — Progress Notes (Signed)
Pt present for annual exam. Pt stated that she was doing well and denies any gyn issues at this time. Pt declined flu vaccine.

## 2020-08-27 NOTE — Patient Instructions (Signed)
Preventive Care 60-60 Years Old, Female Preventive care refers to lifestyle choices and visits with your health care provider that can promote health and wellness. This includes:  A yearly physical exam. This is also called an annual wellness visit.  Regular dental and eye exams.  Immunizations.  Screening for certain conditions.  Healthy lifestyle choices, such as: ? Eating a healthy diet. ? Getting regular exercise. ? Not using drugs or products that contain nicotine and tobacco. ? Limiting alcohol use. What can I expect for my preventive care visit? Physical exam Your health care provider will check your:  Height and weight. These may be used to calculate your BMI (body mass index). BMI is a measurement that tells if you are at a healthy weight.  Heart rate and blood pressure.  Body temperature.  Skin for abnormal spots. Counseling Your health care provider may ask you questions about your:  Past medical problems.  Family's medical history.  Alcohol, tobacco, and drug use.  Emotional well-being.  Home life and relationship well-being.  Sexual activity.  Diet, exercise, and sleep habits.  Work and work Statistician.  Access to firearms.  Method of birth control.  Menstrual cycle.  Pregnancy history. What immunizations do I need? Vaccines are usually given at various ages, according to a schedule. Your health care provider will recommend vaccines for you based on your age, medical history, and lifestyle or other factors, such as travel or where you work.   What tests do I need? Blood tests  Lipid and cholesterol levels. These may be checked every 5 years, or more often if you are over 60 years old.  Hepatitis C test.  Hepatitis B test. Screening  Lung cancer screening. You may have this screening every year starting at age 60 if you have a 30-pack-year history of smoking and currently smoke or have quit within the past 15 years.  Colorectal cancer  screening. ? All adults should have this screening starting at age 60 and continuing until age 17. ? Your health care provider may recommend screening at age 60 if you are at increased risk. ? You will have tests every 1-10 years, depending on your results and the type of screening test.  Diabetes screening. ? This is done by checking your blood sugar (glucose) after you have not eaten for a while (fasting). ? You may have this done every 1-3 years.  Mammogram. ? This may be done every 1-2 years. ? Talk with your health care provider about when you should start having regular mammograms. This may depend on whether you have a family history of breast cancer.  BRCA-related cancer screening. This may be done if you have a family history of breast, ovarian, tubal, or peritoneal cancers.  Pelvic exam and Pap test. ? This may be done every 3 years starting at age 60. ? Starting at age 11, this may be done every 5 years if you have a Pap test in combination with an HPV test. Other tests  STD (sexually transmitted disease) testing, if you are at risk.  Bone density scan. This is done to screen for osteoporosis. You may have this scan if you are at high risk for osteoporosis. Talk with your health care provider about your test results, treatment options, and if necessary, the need for more tests. Follow these instructions at home: Eating and drinking  Eat a diet that includes fresh fruits and vegetables, whole grains, lean protein, and low-fat dairy products.  Take vitamin and mineral supplements  as recommended by your health care provider.  Do not drink alcohol if: ? Your health care provider tells you not to drink. ? You are pregnant, may be pregnant, or are planning to become pregnant.  If you drink alcohol: ? Limit how much you have to 0-1 drink a day. ? Be aware of how much alcohol is in your drink. In the [place] one drink equals one 12 oz bottle of beer (355 mL), one 5 oz glass of  wine (148 mL), or one 1 oz glass of hard liquor (44 mL).   Lifestyle  Take daily care of your teeth and gums. Brush your teeth every morning and night with fluoride toothpaste. Floss one time each day.  Stay active. Exercise for at least 30 minutes 5 or more days each week.  Do not use any products that contain nicotine or tobacco, such as cigarettes, e-cigarettes, and chewing tobacco. If you need help quitting, ask your health care provider.  Do not use drugs.  If you are sexually active, practice safe sex. Use a condom or other form of protection to prevent STIs (sexually transmitted infections).  If you do not wish to become pregnant, use a form of birth control. If you plan to become pregnant, see your health care provider for a prepregnancy visit.  If told by your health care provider, take low-dose aspirin daily starting at age 60.  Find healthy ways to cope with stress, such as: ? Meditation, yoga, or listening to music. ? Journaling. ? Talking to a trusted person. ? Spending time with friends and family. Safety  Always wear your seat belt while driving or riding in a vehicle.  Do not drive: ? If you have been drinking alcohol. Do not ride with someone who has been drinking. ? When you are tired or distracted. ? While texting.  Wear a helmet and other protective equipment during sports activities.  If you have firearms in your house, make sure you follow all gun safety procedures. What's next?  Visit your health care provider once a year for an annual wellness visit.  Ask your health care provider how often you should have your eyes and teeth checked.  Stay up to date on all vaccines. This information is not intended to replace advice given to you by your health care provider. Make sure you discuss any questions you have with your health care provider. Document Revised: 03/25/2020 Document Reviewed: 03/02/2018 Elsevier Patient Education  2021 [place] Breast self-awareness is knowing how your breasts look and feel. Doing breast self-awareness is important. It allows you to catch a breast problem early while it is still small and can be treated. All women should do breast self-awareness, including women who have had breast implants. Tell your doctor if you notice a change in your breasts. What you need:  A mirror.  A well-lit room. How to do a breast self-exam A breast self-exam is one way to learn what is normal for your breasts and to check for changes. To do a breast self-exam: Look for changes 1. Take off all the clothes above your waist. 2. Stand in front of a mirror in a room with good lighting. 3. Put your hands on your hips. 4. Push your hands down. 5. Look at your breasts and nipples in the mirror to see if one breast or nipple looks different from the other. Check to see if: ? The shape of one breast is different. ? The size of  one breast is different. ? There are wrinkles, dips, and bumps in one breast and not the other. 6. Look at each breast for changes in the skin, such as: ? Redness. ? Scaly areas. 7. Look for changes in your nipples, such as: ? Liquid around the nipples. ? Bleeding. ? Dimpling. ? Redness. ? A change in where the nipples are.   Feel for changes 1. Lie on your back on the floor. 2. Feel each breast. To do this, follow these steps: ? Pick a breast to feel. ? Put the arm closest to that breast above your head. ? Use your other arm to feel the nipple area of your breast. Feel the area with the pads of your three middle fingers by making small circles with your fingers. For the first circle, press lightly. For the second circle, press harder. For the third circle, press even harder. ? Keep making circles with your fingers at the different pressures as you move down your breast. Stop when you feel your ribs. ? Move your fingers a little toward the center of your body. ? Start making  circles with your fingers again, this time going up until you reach your collarbone. ? Keep making up-and-down circles until you reach your armpit. Remember to keep using the three pressures. ? Feel the other breast in the same way. 3. Sit or stand in the tub or shower. 4. With soapy water on your skin, feel each breast the same way you did in step 2 when you were lying on the floor.   Write down what you find Writing down what you find can help you remember what to tell your doctor. Write down:  What is normal for each breast.  Any changes you find in each breast, including: ? The kind of changes you find. ? Whether you have pain. ? Size and location of any lumps.  When you last had your menstrual period. General tips  Check your breasts every month.  If you are breastfeeding, the best time to check your breasts is after you feed your baby or after you use a breast pump.  If you get menstrual periods, the best time to check your breasts is 5-7 days after your menstrual period is over.  With time, you will become comfortable with the self-exam, and you will begin to know if there are changes in your breasts. Contact a doctor if you:  See a change in the shape or size of your breasts or nipples.  See a change in the skin of your breast or nipples, such as red or scaly skin.  Have fluid coming from your nipples that is not normal.  Find a lump or thick area that was not there before.  Have pain in your breasts.  Have any concerns about your breast health. Summary  Breast self-awareness includes looking for changes in your breasts, as well as feeling for changes within your breasts.  Breast self-awareness should be done in front of a mirror in a well-lit room.  You should check your breasts every month. If you get menstrual periods, the best time to check your breasts is 5-7 days after your menstrual period is over.  Let your doctor know of any changes you see in your  breasts, including changes in size, changes on the skin, pain or tenderness, or fluid from your nipples that is not normal. This information is not intended to replace advice given to you by your health care provider. Make sure  you discuss any questions you have with your health care provider. Document Revised: 02/07/2018 Document Reviewed: 02/07/2018 Elsevier Patient Education  [place].

## 2020-08-27 NOTE — Progress Notes (Signed)
ANNUAL PREVENTATIVE CARE GYNECOLOGY  ENCOUNTER NOTE  Subjective:       Evelyn Reese is a 60 y.o. G103P2002 female here for a routine annual gynecologic exam. The patient is sexually active. The patient is not taking hormone replacement therapy. Patient denies post-menopausal vaginal bleeding. The patient wears seatbelts: yes. The patient participates in regular exercise: no. Has the patient ever been transfused or tattooed?: no. Reports that she is retired. Enjoys keeping her grandchildren 2-3 days per week.    Current complaints: 1.  Does note history of second COVID infection this past January.  2.  Still reports some vaginal dryness with intercourse. Is using KY-Jelly.  Has tried oral and vaginal estrogen in the past which did not help.    Gynecologic History Patient's last menstrual period was 09/16/2014 (approximate). Contraception: post menopausal status Last Pap: 05/2019 neg/neg.  Reports remote history of abnormal pap smears, has had LEEP x 1.  Last mammogram: 06/10/2020. Results were: normal, BI-RADS 1 Last Colonoscopy: 06/2020.  Results were: 1 polyp removed. Recommend repeat in 5 years.  Last Dexa Scan: 03/2010. Osteopenia present.    Obstetric History OB History  Gravida Para Term Preterm AB Living  2 2 2     2   SAB IAB Ectopic Multiple Live Births          2    # Outcome Date GA Lbr Len/2nd Weight Sex Delivery Anes PTL Lv  2 Term 11/16/90    M Vag-Spont   LIV  1 Term 02/20/89    F Vag-Spont   LIV    Past Medical History:  Diagnosis Date  . Dysplasia of cervix   . Genital herpes   . Hypercholesterolemia   . IBS (irritable bowel syndrome)   . Increased BMI   . LBBB (left bundle branch block)   . Osteopenia   . Vitamin D deficiency     Family History  Problem Relation Age of Onset  . Lymphoma Mother   . Hypertension Father   . Arthritis Father   . Heart disease Brother   . Breast cancer Neg Hx   . Ovarian cancer Neg Hx   . Colon cancer Neg Hx   .  Diabetes Neg Hx     Past Surgical History:  Procedure Laterality Date  . BREAST BIOPSY Left    neg  . BREAST EXCISIONAL BIOPSY    . CARDIAC SURGERY    . DIAGNOSTIC LAPAROSCOPY    . LEEP    . nodule removed Left 1980   breast  . pace maker    . TONSILLECTOMY  1972  . TUBAL LIGATION  2002    Social History   Socioeconomic History  . Marital status: Married    Spouse name: Not on file  . Number of children: Not on file  . Years of education: Not on file  . Highest education level: Not on file  Occupational History  . Not on file  Tobacco Use  . Smoking status: Never Smoker  . Smokeless tobacco: Never Used  Vaping Use  . Vaping Use: Never used  Substance and Sexual Activity  . Alcohol use: No    Alcohol/week: 1.0 standard drink    Types: 1 Standard drinks or equivalent per week    Comment: weekly  . Drug use: No  . Sexual activity: Yes    Partners: Male    Birth control/protection: Post-menopausal  Other Topics Concern  . Not on file  Social History Narrative  .  Not on file   Social Determinants of Health   Financial Resource Strain: Not on file  Food Insecurity: Not on file  Transportation Needs: Not on file  Physical Activity: Not on file  Stress: Not on file  Social Connections: Not on file  Intimate Partner Violence: Not on file    Current Outpatient Medications on File Prior to Visit  Medication Sig Dispense Refill  . clopidogrel (PLAVIX) 75 MG tablet TAKE 1 TABLET BY MOUTH ONCE DAILY.    . furosemide (LASIX) 40 MG tablet Take by mouth.    . losartan (COZAAR) 25 MG tablet TAKE 1 TABLET BY MOUTH ONCE DAILY.    . metoprolol succinate (TOPROL-XL) 50 MG 24 hr tablet Take 100 mg by mouth 2 (two) times daily.     . potassium chloride (K-DUR) 10 MEQ tablet Take by mouth.    . rosuvastatin (CRESTOR) 10 MG tablet Take by mouth.    . spironolactone (ALDACTONE) 25 MG tablet Take by mouth.    . warfarin (COUMADIN) 1 MG tablet Resume when and as directed by your  out patient provider to maintain an INR of 2 - 3 for mechAoVR    . warfarin (COUMADIN) 4 MG tablet Take 2 mg by mouth on the night of 7.21.2018 and 4 mg on the night of 7.22.2018. Upon repeat INR check on Monday 7.23.2018 you will then resume Warfarin regimen when and as directed by your out patient provider to maintain an INR of 2 - 3 for mechAoVR    . metoprolol tartrate (LOPRESSOR) 25 MG tablet Take by mouth.     No current facility-administered medications on file prior to visit.    Allergies  Allergen Reactions  . Lisinopril Swelling  . Penicillins Rash      Review of Systems ROS Review of Systems - General ROS: negative for - chills, fatigue, fever, hot flashes, night sweats, weight gain or weight loss Psychological ROS: negative for - anxiety, decreased libido, depression, mood swings, physical abuse or sexual abuse Ophthalmic ROS: negative for - blurry vision, eye pain or loss of vision ENT ROS: negative for - headaches, hearing change, visual changes or vocal changes Allergy and Immunology ROS: negative for - hives, itchy/watery eyes or seasonal allergies Hematological and Lymphatic ROS: negative for - bleeding problems, bruising, swollen lymph nodes or weight loss Endocrine ROS: negative for - galactorrhea, hair pattern changes, hot flashes, malaise/lethargy, mood swings, palpitations, polydipsia/polyuria, skin changes, temperature intolerance or unexpected weight changes Breast ROS: negative for - new or changing breast lumps or nipple discharge Respiratory ROS: negative for - cough or shortness of breath Cardiovascular ROS: negative for - chest pain, irregular heartbeat, palpitations or shortness of breath Gastrointestinal ROS: no abdominal pain, change in bowel habits, or black or bloody stools Genito-Urinary ROS: no dysuria, trouble voiding, or hematuria. Positive for vaginal dryness (see HPI).  Musculoskeletal ROS: negative for - joint pain or joint stiffness Neurological  ROS: negative for - bowel and bladder control changes Dermatological ROS: negative for rash and skin lesion changes   Objective:   BP (!) 88/57   Pulse 67   Ht 5\' 2"  (1.575 m)   Wt 189 lb 4.8 oz (85.9 kg)   LMP 09/16/2014 (Approximate)   BMI 34.62 kg/m  CONSTITUTIONAL: Well-developed, well-nourished female in no acute distress.  PSYCHIATRIC: Normal mood and affect. Normal behavior. Normal judgment and thought content. NEUROLGIC: Alert and oriented to person, place, and time. Normal muscle tone coordination. No cranial nerve deficit noted. HENT:  Normocephalic, atraumatic, External right and left ear normal. Oropharynx is clear and moist EYES: Conjunctivae and EOM are normal. Pupils are equal, round, and reactive to light. No scleral icterus.  NECK: Normal range of motion, supple, no masses.  Normal thyroid.  SKIN: Skin is warm and dry. No rash noted. Not diaphoretic. No erythema. No pallor. CARDIOVASCULAR: Normal heart rate noted, regular rhythm, no murmur. RESPIRATORY: Clear to auscultation bilaterally. Effort and breath sounds normal, no problems with respiration noted. BREASTS: Symmetric in size. No masses, skin changes, nipple drainage, or lymphadenopathy. ABDOMEN: Soft, normal bowel sounds, no distention noted.  No tenderness, rebound or guarding.  BLADDER: Normal PELVIC:  Bladder no bladder distension noted  Urethra: normal appearing urethra with no masses, tenderness or lesions  Vulva: normal appearing vulva with no masses, tenderness or lesions  Vagina: moderate atrophy. No discharge or lesions.   Cervix: Scarred; os is not visible; no cervical motion tenderness  Uterus: uterus is normal size, shape, consistency and nontender  Adnexa: normal adnexa in size, nontender and no masses  RV: External Exam NormaI, No Rectal Masses and Normal Sphincter tone  MUSCULOSKELETAL: Normal range of motion. No tenderness.  No cyanosis, clubbing, or edema.  2+ distal pulses. LYMPHATIC: No  Axillary, Supraclavicular, or Inguinal Adenopathy.   Labs: No results found for: WBC, HGB, HCT, MCV, PLT  No results found for: CREATININE, BUN, NA, K, CL, CO2  No results found for: ALT, AST, GGT, ALKPHOS, BILITOT  No results found for: CHOL, HDL, LDLCALC, LDLDIRECT, TRIG, CHOLHDL  No results found for: TSH  No results found for: HGBA1C   Assessment:   1. Well woman exam with routine gynecological exam   2. Need for hepatitis C screening test   3. Menopause   4. Vaginal atrophy   5. Anticoagulated on warfarin   6. Osteopenia of other site   7. History of COVID-19     Plan:  Pap: Pap Co Test done today.  Mammogram: Up to date. Continue yearly routine screening.  Stool Guaiac Testing:  Not Indicated. Patient up to date on colonoscopy.   Labs: drawn by PCP.  Routine preventative health maintenance measures emphasized: Exercise/Diet/Weight control, Tobacco Warnings, Alcohol/Substance use risks, Stress Management, Peer Pressure Issues and Safe Sex  Encourage VItamin D and Calcium supplementation Flu vaccine: declines. Notes she missed it earlier in the year so will just wait until next year.  COVID vaccine: declines.  Discussed management options again for vaginal atrophy including Intrarosa, or different lubricant. Given samples of UberLube and also info on Joe's H20. To notify if she desires prescription.  Return to Clinic - 1 Year for annual exam. Sooner if she desires trial of Intrarosa.    Hildred Laser, MD  [facility]

## 2020-08-29 ENCOUNTER — Telehealth: Payer: Self-pay

## 2020-08-29 NOTE — Telephone Encounter (Signed)
Called pt lmtrc about calling the office back to make an appt for a urine drop off.

## 2020-08-29 NOTE — Telephone Encounter (Signed)
Patient was not able to come in to drop off a urine sample. She is having trouble empty her bladder and having some pain. I have told her to try to increase her fluid intake and try cranberry juice or pills. She can also try Azo pills. If this does not help she should be seen at an urgent care or a walkin clinic near her. Patient stated that she lives an hour away.

## 2020-08-29 NOTE — Telephone Encounter (Signed)
Please contact pt to see if she wanted to come in on the nurse schedule for a urine drop off and see Evelyn Reese. Thanks [employer]

## 2020-08-29 NOTE — Telephone Encounter (Signed)
Pt called in and stated that she had an appointment on Wednesday. The pt is having uti symptoms, the pt is requesting a call back, or something sent into her pharmacy. Please advise

## 2020-09-02 ENCOUNTER — Other Ambulatory Visit (INDEPENDENT_AMBULATORY_CARE_PROVIDER_SITE_OTHER): Payer: Medicare Other

## 2020-09-02 ENCOUNTER — Telehealth: Payer: Self-pay

## 2020-09-02 ENCOUNTER — Other Ambulatory Visit: Payer: Self-pay

## 2020-09-02 DIAGNOSIS — N3001 Acute cystitis with hematuria: Secondary | ICD-10-CM | POA: Diagnosis not present

## 2020-09-02 LAB — POCT URINALYSIS DIPSTICK
Bilirubin, UA: NEGATIVE
Glucose, UA: NEGATIVE
Ketones, UA: NEGATIVE
Nitrite, UA: NEGATIVE
Odor: NEGATIVE
Protein, UA: NEGATIVE
Spec Grav, UA: 1.01 (ref 1.010–1.025)
Urobilinogen, UA: 0.2 E.U./dL
pH, UA: 7.5 (ref 5.0–8.0)

## 2020-09-02 MED ORDER — NITROFURANTOIN MONOHYD MACRO 100 MG PO CAPS: 100.0000 mg | ORAL_CAPSULE | Freq: Two times a day (BID) | ORAL | 0 refills | Status: DC

## 2020-09-02 NOTE — Telephone Encounter (Cosign Needed)
Pt states she is having issues emptying her bladder x 4 days. She will have muscle spasm. NO fevers noted.    U/a- pos for blood and leuks.  Culture sent.  Macrobid erx.   Pt to contact office with fever or severe pain. Pt voiced understanding.

## 2020-09-05 LAB — URINE CULTURE

## 2020-09-08 ENCOUNTER — Other Ambulatory Visit: Payer: Self-pay

## 2020-09-08 MED ORDER — NITROFURANTOIN MONOHYD MACRO 100 MG PO CAPS: 100.0000 mg | ORAL_CAPSULE | Freq: Two times a day (BID) | ORAL | 0 refills | Status: AC

## 2021-06-19 ENCOUNTER — Other Ambulatory Visit: Payer: Self-pay | Admitting: Internal Medicine

## 2021-06-19 DIAGNOSIS — Z1231 Encounter for screening mammogram for malignant neoplasm of breast: Secondary | ICD-10-CM

## 2021-07-15 ENCOUNTER — Ambulatory Visit
Admission: RE | Admit: 2021-07-15 | Discharge: 2021-07-15 | Disposition: A | Discharge status: Home / Self Care | Payer: Medicare Other | Source: Ambulatory Visit | Attending: Internal Medicine | Admitting: Internal Medicine

## 2021-07-15 ENCOUNTER — Other Ambulatory Visit: Payer: Self-pay

## 2021-07-15 DIAGNOSIS — Z1231 Encounter for screening mammogram for malignant neoplasm of breast: Secondary | ICD-10-CM | POA: Diagnosis not present

## 2022-06-18 ENCOUNTER — Other Ambulatory Visit: Payer: Self-pay | Admitting: Internal Medicine

## 2022-06-18 DIAGNOSIS — Z1231 Encounter for screening mammogram for malignant neoplasm of breast: Secondary | ICD-10-CM

## 2022-11-15 ENCOUNTER — Ambulatory Visit
Admission: RE | Admit: 2022-11-15 | Discharge: 2022-11-15 | Disposition: A | Discharge status: Home / Self Care | Payer: Medicare Other | Source: Ambulatory Visit | Attending: Internal Medicine | Admitting: Internal Medicine

## 2022-11-15 DIAGNOSIS — Z1231 Encounter for screening mammogram for malignant neoplasm of breast: Secondary | ICD-10-CM | POA: Insufficient documentation

## 2023-06-22 ENCOUNTER — Other Ambulatory Visit: Payer: Self-pay | Admitting: Internal Medicine

## 2023-06-22 DIAGNOSIS — Z1231 Encounter for screening mammogram for malignant neoplasm of breast: Secondary | ICD-10-CM

## 2024-01-23 ENCOUNTER — Ambulatory Visit
Admission: RE | Admit: 2024-01-23 | Discharge: 2024-01-23 | Disposition: A | Discharge status: Home / Self Care | Source: Ambulatory Visit | Attending: Internal Medicine | Admitting: Internal Medicine

## 2024-01-23 DIAGNOSIS — Z1231 Encounter for screening mammogram for malignant neoplasm of breast: Secondary | ICD-10-CM | POA: Insufficient documentation
